# Patient Record
Sex: Female | Born: 1970 | Race: White | Hispanic: No | Marital: Married | Smoking: Former smoker
Health system: Southern US, Community
[De-identification: ages and names within clinical notes are randomized; demographics above are authoritative.]

## PROBLEM LIST (undated history)

## (undated) DIAGNOSIS — N189 Chronic kidney disease, unspecified: Secondary | ICD-10-CM

## (undated) DIAGNOSIS — R112 Nausea with vomiting, unspecified: Secondary | ICD-10-CM

## (undated) DIAGNOSIS — Z9889 Other specified postprocedural states: Secondary | ICD-10-CM

## (undated) DIAGNOSIS — J302 Other seasonal allergic rhinitis: Secondary | ICD-10-CM

## (undated) DIAGNOSIS — J40 Bronchitis, not specified as acute or chronic: Secondary | ICD-10-CM

## (undated) HISTORY — PX: AUGMENTATION MAMMAPLASTY: SUR837

## (undated) HISTORY — PX: OTHER SURGICAL HISTORY: SHX169

---
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
## 1989-05-03 HISTORY — PX: KNEE ARTHROSCOPY: SHX127

## 1990-05-03 HISTORY — PX: KNEE ARTHROSCOPY: SUR90

## 1992-05-03 HISTORY — PX: CYSTOSCOPY W/ STONE MANIPULATION: SHX1427

## 1997-05-03 HISTORY — PX: BREAST ENHANCEMENT SURGERY: SHX7

## 1998-05-03 HISTORY — PX: AUGMENTATION MAMMAPLASTY: SUR837

## 2009-09-11 ENCOUNTER — Other Ambulatory Visit: Admission: RE | Admit: 2009-09-11 | Discharge: 2009-09-11 | Payer: Self-pay | Admitting: Gynecology

## 2009-09-11 ENCOUNTER — Ambulatory Visit: Payer: Self-pay | Admitting: Gynecology

## 2010-01-23 ENCOUNTER — Ambulatory Visit: Payer: Self-pay | Admitting: Gynecology

## 2010-03-14 ENCOUNTER — Emergency Department (HOSPITAL_COMMUNITY): Admission: EM | Admit: 2010-03-14 | Discharge: 2010-03-14 | Payer: Self-pay | Admitting: Emergency Medicine

## 2010-04-10 ENCOUNTER — Ambulatory Visit: Payer: Self-pay | Admitting: Gynecology

## 2010-04-13 ENCOUNTER — Ambulatory Visit: Payer: Self-pay | Admitting: Gynecology

## 2010-05-12 ENCOUNTER — Ambulatory Visit
Admission: RE | Admit: 2010-05-12 | Discharge: 2010-05-12 | Payer: Self-pay | Source: Home / Self Care | Attending: Gynecology | Admitting: Gynecology

## 2012-08-01 HISTORY — PX: HIP SURGERY: SHX245

## 2012-08-11 ENCOUNTER — Other Ambulatory Visit: Payer: Self-pay | Admitting: Plastic Surgery

## 2012-08-11 ENCOUNTER — Encounter (HOSPITAL_BASED_OUTPATIENT_CLINIC_OR_DEPARTMENT_OTHER): Payer: Self-pay | Admitting: *Deleted

## 2012-08-11 DIAGNOSIS — M7989 Other specified soft tissue disorders: Secondary | ICD-10-CM
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
## 2012-08-15 ENCOUNTER — Other Ambulatory Visit: Payer: Self-pay | Admitting: Plastic Surgery
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
## 2012-08-17 ENCOUNTER — Encounter (HOSPITAL_BASED_OUTPATIENT_CLINIC_OR_DEPARTMENT_OTHER): Payer: Self-pay | Admitting: Anesthesiology

## 2012-08-17 ENCOUNTER — Ambulatory Visit (HOSPITAL_BASED_OUTPATIENT_CLINIC_OR_DEPARTMENT_OTHER): Payer: Worker's Compensation | Admitting: Certified Registered"

## 2012-08-17 ENCOUNTER — Ambulatory Visit (HOSPITAL_BASED_OUTPATIENT_CLINIC_OR_DEPARTMENT_OTHER)
Admission: RE | Admit: 2012-08-17 | Discharge: 2012-08-17 | Disposition: A | Discharge status: Home / Self Care | Payer: Worker's Compensation | Source: Ambulatory Visit | Attending: Plastic Surgery | Admitting: Plastic Surgery

## 2012-08-17 ENCOUNTER — Encounter (HOSPITAL_BASED_OUTPATIENT_CLINIC_OR_DEPARTMENT_OTHER): Payer: Self-pay | Admitting: Certified Registered"

## 2012-08-17 ENCOUNTER — Encounter (HOSPITAL_BASED_OUTPATIENT_CLINIC_OR_DEPARTMENT_OTHER)
Admission: RE | Disposition: A | Discharge status: Home / Self Care | Payer: Self-pay | Source: Ambulatory Visit | Attending: Plastic Surgery

## 2012-08-17 DIAGNOSIS — D179 Benign lipomatous neoplasm, unspecified: Secondary | ICD-10-CM | POA: Diagnosis present

## 2012-08-17 DIAGNOSIS — M7989 Other specified soft tissue disorders: Secondary | ICD-10-CM

## 2012-08-17 DIAGNOSIS — L988 Other specified disorders of the skin and subcutaneous tissue: Secondary | ICD-10-CM | POA: Insufficient documentation

## 2012-08-17 HISTORY — DX: Chronic kidney disease, unspecified: N18.9

## 2012-08-17 HISTORY — DX: Other specified postprocedural states: R11.2

## 2012-08-17 HISTORY — DX: Bronchitis, not specified as acute or chronic: J40

## 2012-08-17 HISTORY — DX: Other seasonal allergic rhinitis: J30.2

## 2012-08-17 HISTORY — DX: Other specified postprocedural states: Z98.890

## 2012-08-17 HISTORY — PX: MASS EXCISION: SHX2000

## 2012-08-17 SURGERY — EXCISION MASS
Anesthesia: General | Site: Thigh | Laterality: Right | Wound class: Clean

## 2012-08-17 MED ORDER — LACTATED RINGERS IV SOLN
INTRAVENOUS | Status: DC
  Administered 2012-08-17: 12:00:00 via INTRAVENOUS

## 2012-08-17 MED ORDER — ACETAMINOPHEN 10 MG/ML IV SOLN
1000.0000 mg | Freq: Once | INTRAVENOUS | Status: AC
  Administered 2012-08-17: 1000 mg via INTRAVENOUS

## 2012-08-17 MED ORDER — PROPOFOL 10 MG/ML IV BOLUS
INTRAVENOUS | Status: DC | PRN
  Administered 2012-08-17: 200 mg via INTRAVENOUS

## 2012-08-17 MED ORDER — SCOPOLAMINE 1 MG/3DAYS TD PT72
1.0000 | MEDICATED_PATCH | TRANSDERMAL | Status: DC
  Administered 2012-08-17: 1.5 mg via TRANSDERMAL

## 2012-08-17 MED ORDER — HYDROMORPHONE HCL PF 1 MG/ML IJ SOLN: 0.2500 mg | INTRAMUSCULAR | Status: DC | PRN

## 2012-08-17 MED ORDER — MIDAZOLAM HCL 5 MG/5ML IJ SOLN
INTRAMUSCULAR | Status: DC | PRN
  Administered 2012-08-17: 2 mg via INTRAVENOUS

## 2012-08-17 MED ORDER — OXYCODONE HCL 5 MG/5ML PO SOLN: 5.0000 mg | Freq: Once | ORAL | Status: AC | PRN

## 2012-08-17 MED ORDER — DEXAMETHASONE SODIUM PHOSPHATE 4 MG/ML IJ SOLN
INTRAMUSCULAR | Status: DC | PRN
  Administered 2012-08-17: 10 mg via INTRAVENOUS

## 2012-08-17 MED ORDER — LIDOCAINE-EPINEPHRINE 1 %-1:100000 IJ SOLN
INTRAMUSCULAR | Status: DC | PRN
  Administered 2012-08-17: 20 mL

## 2012-08-17 MED ORDER — ONDANSETRON HCL 4 MG/2ML IJ SOLN: 4.0000 mg | Freq: Once | INTRAMUSCULAR | Status: DC | PRN

## 2012-08-17 MED ORDER — CEFAZOLIN SODIUM-DEXTROSE 2-3 GM-% IV SOLR
2.0000 g | INTRAVENOUS | Status: AC
  Administered 2012-08-17: 2 g via INTRAVENOUS

## 2012-08-17 MED ORDER — FENTANYL CITRATE 0.05 MG/ML IJ SOLN
INTRAMUSCULAR | Status: DC | PRN
  Administered 2012-08-17: 100 ug via INTRAVENOUS

## 2012-08-17 MED ORDER — ONDANSETRON HCL 4 MG/2ML IJ SOLN
INTRAMUSCULAR | Status: DC | PRN
  Administered 2012-08-17: 4 mg via INTRAVENOUS

## 2012-08-17 MED ORDER — OXYCODONE HCL 5 MG PO TABS
5.0000 mg | ORAL_TABLET | Freq: Once | ORAL | Status: AC | PRN
  Administered 2012-08-17: 5 mg via ORAL

## 2012-08-17 MED ORDER — LIDOCAINE HCL (CARDIAC) 20 MG/ML IV SOLN
INTRAVENOUS | Status: DC | PRN
  Administered 2012-08-17: 80 mg via INTRAVENOUS

## 2012-08-17 SURGICAL SUPPLY — 90 items
BAG DECANTER FOR FLEXI CONT (MISCELLANEOUS) IMPLANT
BANDAGE ELASTIC 3 VELCRO ST LF (GAUZE/BANDAGES/DRESSINGS) IMPLANT
BANDAGE ELASTIC 4 VELCRO ST LF (GAUZE/BANDAGES/DRESSINGS) IMPLANT
BANDAGE ELASTIC 6 VELCRO ST LF (GAUZE/BANDAGES/DRESSINGS) IMPLANT
BANDAGE GAUZE ELAST BULKY 4 IN (GAUZE/BANDAGES/DRESSINGS) IMPLANT
BENZOIN TINCTURE PRP APPL 2/3 (GAUZE/BANDAGES/DRESSINGS) IMPLANT
BLADE HEX COATED 2.75 (ELECTRODE) ×2 IMPLANT
BLADE MINI RND TIP GREEN BEAV (BLADE) IMPLANT
BLADE SURG 10 STRL SS (BLADE) IMPLANT
BLADE SURG 15 STRL LF DISP TIS (BLADE) ×1 IMPLANT
BLADE SURG 15 STRL SS (BLADE) ×1
BNDG COHESIVE 1X5 TAN STRL LF (GAUZE/BANDAGES/DRESSINGS) IMPLANT
BNDG COHESIVE 4X5 TAN STRL (GAUZE/BANDAGES/DRESSINGS) IMPLANT
BNDG ESMARK 4X9 LF (GAUZE/BANDAGES/DRESSINGS) IMPLANT
CANISTER OMNI JUG 16 LITER (MISCELLANEOUS) IMPLANT
CANISTER SUCTION 1200CC (MISCELLANEOUS) ×2 IMPLANT
CANISTER SUCTION 2500CC (MISCELLANEOUS) IMPLANT
CHLORAPREP W/TINT 26ML (MISCELLANEOUS) ×2 IMPLANT
CLOTH BEACON ORANGE TIMEOUT ST (SAFETY) ×2 IMPLANT
CORDS BIPOLAR (ELECTRODE) IMPLANT
COVER MAYO STAND STRL (DRAPES) ×2 IMPLANT
COVER TABLE BACK 60X90 (DRAPES) ×2 IMPLANT
DECANTER SPIKE VIAL GLASS SM (MISCELLANEOUS) IMPLANT
DERMABOND ADVANCED (GAUZE/BANDAGES/DRESSINGS) ×1
DERMABOND ADVANCED .7 DNX12 (GAUZE/BANDAGES/DRESSINGS) ×1 IMPLANT
DRAIN PENROSE 1/2X12 LTX STRL (WOUND CARE) IMPLANT
DRAPE EXTREMITY T 121X128X90 (DRAPE) ×2 IMPLANT
DRAPE INCISE IOBAN 66X45 STRL (DRAPES) IMPLANT
DRSG ADAPTIC 3X8 NADH LF (GAUZE/BANDAGES/DRESSINGS) IMPLANT
DRSG EMULSION OIL 3X3 NADH (GAUZE/BANDAGES/DRESSINGS) IMPLANT
DRSG PAD ABDOMINAL 8X10 ST (GAUZE/BANDAGES/DRESSINGS) IMPLANT
ELECT NEEDLE TIP 2.8 STRL (NEEDLE) IMPLANT
ELECT REM PT RETURN 9FT ADLT (ELECTROSURGICAL) ×2
ELECTRODE REM PT RTRN 9FT ADLT (ELECTROSURGICAL) ×1 IMPLANT
GAUZE SPONGE 4X4 12PLY STRL LF (GAUZE/BANDAGES/DRESSINGS) IMPLANT
GAUZE XEROFORM 1X8 LF (GAUZE/BANDAGES/DRESSINGS) IMPLANT
GAUZE XEROFORM 5X9 LF (GAUZE/BANDAGES/DRESSINGS) IMPLANT
GLOVE BIO SURGEON STRL SZ 6.5 (GLOVE) ×4 IMPLANT
GLOVE BIOGEL PI IND STRL 7.0 (GLOVE) ×1 IMPLANT
GLOVE BIOGEL PI INDICATOR 7.0 (GLOVE) ×1
GLOVE ECLIPSE 6.5 STRL STRAW (GLOVE) ×2 IMPLANT
GOWN PREVENTION PLUS XLARGE (GOWN DISPOSABLE) ×6 IMPLANT
HANDPIECE INTERPULSE COAX TIP (DISPOSABLE)
IV NS IRRIG 3000ML ARTHROMATIC (IV SOLUTION) IMPLANT
NEEDLE 27GAX1X1/2 (NEEDLE) ×2 IMPLANT
NEEDLE HYPO 30GX1 BEV (NEEDLE) IMPLANT
NS IRRIG 1000ML POUR BTL (IV SOLUTION) ×2 IMPLANT
PACK BASIN DAY SURGERY FS (CUSTOM PROCEDURE TRAY) ×2 IMPLANT
PADDING CAST ABS 3INX4YD NS (CAST SUPPLIES)
PADDING CAST ABS 4INX4YD NS (CAST SUPPLIES)
PADDING CAST ABS COTTON 3X4 (CAST SUPPLIES) IMPLANT
PADDING CAST ABS COTTON 4X4 ST (CAST SUPPLIES) IMPLANT
PENCIL BUTTON HOLSTER BLD 10FT (ELECTRODE) ×2 IMPLANT
SET HNDPC FAN SPRY TIP SCT (DISPOSABLE) IMPLANT
SHEET MEDIUM DRAPE 40X70 STRL (DRAPES) IMPLANT
SLEEVE SCD COMPRESS KNEE MED (MISCELLANEOUS) ×2 IMPLANT
SPLINT PLASTER CAST XFAST 3X15 (CAST SUPPLIES) IMPLANT
SPLINT PLASTER XTRA FASTSET 3X (CAST SUPPLIES)
SPONGE GAUZE 2X2 8PLY STRL LF (GAUZE/BANDAGES/DRESSINGS) ×2 IMPLANT
SPONGE GAUZE 4X4 12PLY (GAUZE/BANDAGES/DRESSINGS) ×2 IMPLANT
SPONGE LAP 18X18 X RAY DECT (DISPOSABLE) ×2 IMPLANT
SPONGE LAP 4X18 X RAY DECT (DISPOSABLE) IMPLANT
STAPLER VISISTAT 35W (STAPLE) IMPLANT
STOCKINETTE 4X48 STRL (DRAPES) IMPLANT
STOCKINETTE 6  STRL (DRAPES) ×1
STOCKINETTE 6 STRL (DRAPES) ×1 IMPLANT
STOCKINETTE IMPERVIOUS LG (DRAPES) IMPLANT
STRIP CLOSURE SKIN 1/2X4 (GAUZE/BANDAGES/DRESSINGS) IMPLANT
SUCTION FRAZIER TIP 10 FR DISP (SUCTIONS) IMPLANT
SURGILUBE 2OZ TUBE FLIPTOP (MISCELLANEOUS) IMPLANT
SUT ETHILON 3 0 PS 1 (SUTURE) IMPLANT
SUT ETHILON 4 0 P 3 18 (SUTURE) IMPLANT
SUT ETHILON 5 0 PS 2 18 (SUTURE) IMPLANT
SUT MON AB 5-0 PS2 18 (SUTURE) ×2 IMPLANT
SUT PROLENE 3 0 PS 2 (SUTURE) IMPLANT
SUT SILK 3 0 PS 1 (SUTURE) IMPLANT
SUT VIC AB 3-0 FS2 27 (SUTURE) IMPLANT
SUT VIC AB 5-0 P-3 18X BRD (SUTURE) IMPLANT
SUT VIC AB 5-0 P3 18 (SUTURE)
SUT VIC AB 5-0 PS2 18 (SUTURE) IMPLANT
SUT VICRYL 4-0 PS2 18IN ABS (SUTURE) ×2 IMPLANT
SYR BULB IRRIGATION 50ML (SYRINGE) ×2 IMPLANT
SYR CONTROL 10ML LL (SYRINGE) ×2 IMPLANT
TAPE HYPAFIX 6X30 (GAUZE/BANDAGES/DRESSINGS) IMPLANT
TAPE STRIPS DRAPE STRL (GAUZE/BANDAGES/DRESSINGS) ×2 IMPLANT
TOWEL OR 17X24 6PK STRL BLUE (TOWEL DISPOSABLE) ×4 IMPLANT
TRAY DSU PREP LF (CUSTOM PROCEDURE TRAY) IMPLANT
TUBE CONNECTING 20X1/4 (TUBING) ×2 IMPLANT
UNDERPAD 30X30 INCONTINENT (UNDERPADS AND DIAPERS) ×2 IMPLANT
YANKAUER SUCT BULB TIP NO VENT (SUCTIONS) ×2 IMPLANT

## 2012-08-17 NOTE — Anesthesia Postprocedure Evaluation (Signed)
  Anesthesia Post-op Note  Patient: Deborah Bright  Procedure(s) Performed: Procedure(s): EXCISION OF RIGHT LEG FAT NECROSIS (Right)  Patient Location: PACU  Anesthesia Type:General  Level of Consciousness: awake, alert  and oriented  Airway and Oxygen Therapy: Patient Spontanous Breathing and Patient connected to face mask oxygen  Post-op Pain: none  Post-op Assessment: Post-op Vital signs reviewed  Post-op Vital Signs: Reviewed  Complications: No apparent anesthesia complications
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
## 2012-08-17 NOTE — Interval H&P Note (Signed)
History and Physical Interval Note:  08/17/2012 11:41 AM  Deborah Bright  has presented today for surgery, with the diagnosis of fat necrosis of right leg  The various methods of treatment have been discussed with the patient and family. After consideration of risks, benefits and other options for treatment, the patient has consented to  Procedure(s): EXCISION OF RIGHT LEG FAT NECROSIS (Right) as a surgical intervention .  The patient's history has been reviewed, patient examined, no change in status, stable for surgery.  I have reviewed the patient's chart and labs.  Questions were answered to the patient's satisfaction.     Bright,Deborah Bitter

## 2012-08-17 NOTE — Brief Op Note (Signed)
08/17/2012  12:51 PM  PATIENT:  Deborah Bright  42 y.o. female  PRE-OPERATIVE DIAGNOSIS:  fat necrosis of right leg  POST-OPERATIVE DIAGNOSIS:  fat necrosis of right leg  PROCEDURE:  Procedure(s): EXCISION OF RIGHT LEG FAT NECROSIS (Right)  SURGEON:  Surgeon(s) and Role:    * Claire Sanger, DO - Primary  PHYSICIAN ASSISTANT: Shawn Rayburn, PA  ASSISTANTS: none   ANESTHESIA:   local and general  EBL:  Total I/O In: 1500 [I.V.:1500] Out: -   BLOOD ADMINISTERED:none  DRAINS: none   LOCAL MEDICATIONS USED:  LIDOCAINE   SPECIMEN:  Source of Specimen:  right leg fat - fat necrosis  DISPOSITION OF SPECIMEN:  PATHOLOGY  COUNTS:  YES  TOURNIQUET:  * No tourniquets in log *  DICTATION: .Dragon Dictation  PLAN OF CARE: Discharge to home after PACU  PATIENT DISPOSITION:  PACU - hemodynamically stable.   Delay start of Pharmacological VTE agent (>24hrs) due to surgical blood loss or risk of bleeding: no

## 2012-08-17 NOTE — Anesthesia Procedure Notes (Signed)
Procedure Name: LMA Insertion Performed by: Jamarea Selner W Pre-anesthesia Checklist: Patient identified, Timeout performed, Emergency Drugs available, Suction available and Patient being monitored Patient Re-evaluated:Patient Re-evaluated prior to inductionOxygen Delivery Method: Circle system utilized Preoxygenation: Pre-oxygenation with 100% oxygen Intubation Type: IV induction Ventilation: Mask ventilation without difficulty LMA: LMA inserted LMA Size: 4.0 Number of attempts: 1 Placement Confirmation: breath sounds checked- equal and bilateral and positive ETCO2 Tube secured with: Tape Dental Injury: Teeth and Oropharynx as per pre-operative assessment      

## 2012-08-17 NOTE — Anesthesia Preprocedure Evaluation (Signed)

## 2012-08-17 NOTE — Transfer of Care (Signed)
Immediate Anesthesia Transfer of Care Note  Patient: Deborah Bright  Procedure(s) Performed: Procedure(s): EXCISION OF RIGHT LEG FAT NECROSIS (Right)  Patient Location: PACU  Anesthesia Type:General  Level of Consciousness: awake, alert  and oriented  Airway & Oxygen Therapy: Patient Spontanous Breathing and Patient connected to face mask oxygen  Post-op Assessment: Report given to PACU RN and Post -op Vital signs reviewed and stable  Post vital signs: Reviewed and stable  Complications: No apparent anesthesia complications

## 2012-08-21 ENCOUNTER — Encounter (HOSPITAL_BASED_OUTPATIENT_CLINIC_OR_DEPARTMENT_OTHER): Payer: Self-pay | Admitting: Plastic Surgery

## 2014-01-04 ENCOUNTER — Ambulatory Visit (INDEPENDENT_AMBULATORY_CARE_PROVIDER_SITE_OTHER): Payer: 59 | Admitting: Gynecology

## 2014-01-04 ENCOUNTER — Encounter: Payer: Self-pay | Admitting: Gynecology

## 2014-01-04 ENCOUNTER — Other Ambulatory Visit: Payer: 59

## 2014-01-04 ENCOUNTER — Other Ambulatory Visit (HOSPITAL_COMMUNITY)
Admission: RE | Admit: 2014-01-04 | Discharge: 2014-01-04 | Disposition: A | Discharge status: Home / Self Care | Payer: 59 | Source: Ambulatory Visit | Attending: Gynecology | Admitting: Gynecology

## 2014-01-04 VITALS — BP 118/70 | Ht 66.0 in | Wt 160.0 lb

## 2014-01-04 DIAGNOSIS — N760 Acute vaginitis: Secondary | ICD-10-CM

## 2014-01-04 DIAGNOSIS — Z01419 Encounter for gynecological examination (general) (routine) without abnormal findings: Secondary | ICD-10-CM | POA: Diagnosis not present

## 2014-01-04 DIAGNOSIS — Z1151 Encounter for screening for human papillomavirus (HPV): Secondary | ICD-10-CM | POA: Diagnosis present

## 2014-01-04 DIAGNOSIS — B9689 Other specified bacterial agents as the cause of diseases classified elsewhere: Secondary | ICD-10-CM

## 2014-01-04 DIAGNOSIS — N898 Other specified noninflammatory disorders of vagina: Secondary | ICD-10-CM

## 2014-01-04 DIAGNOSIS — R635 Abnormal weight gain: Secondary | ICD-10-CM

## 2014-01-04 DIAGNOSIS — T8339XA Other mechanical complication of intrauterine contraceptive device, initial encounter: Secondary | ICD-10-CM

## 2014-01-04 DIAGNOSIS — A499 Bacterial infection, unspecified: Secondary | ICD-10-CM

## 2014-01-04 LAB — WET PREP FOR TRICH, YEAST, CLUE
TRICH WET PREP: NONE SEEN
WBC, Wet Prep HPF POC: NONE SEEN
YEAST WET PREP: NONE SEEN

## 2014-01-04 MED ORDER — TINIDAZOLE 500 MG PO TABS: ORAL_TABLET | ORAL | Status: DC

## 2014-01-04 MED ORDER — IBUPROFEN 800 MG PO TABS: 800.0000 mg | ORAL_TABLET | Freq: Three times a day (TID) | ORAL | Status: DC | PRN

## 2014-01-04 NOTE — Patient Instructions (Addendum)
Tinidazole tablets What is this medicine? TINIDAZOLE (tye NI da zole) is an antiinfective. It is used to treat amebiasis, giardiasis, trichomoniasis, and vaginosis. It will not work for colds, flu, or other viral infections. This medicine may be used for other purposes; ask your health care provider or pharmacist if you have questions. COMMON BRAND NAME(S): Tindamax What should I tell my health care provider before I take this medicine? They need to know if you have any of these conditions: -anemia or other blood disorders -if you frequently drink alcohol containing drinks -receiving hemodialysis -seizure disorder -an unusual or allergic reaction to tinidazole, other medicines, foods, dyes, or preservatives -pregnant or trying to get pregnant -breast-feeding How should I use this medicine? Take this medicine by mouth with a full glass of water. Follow the directions on the prescription label. Take with food. Take your medicine at regular intervals. Do not take your medicine more often than directed. Take all of your medicine as directed even if you think you are better. Do not skip doses or stop your medicine early. Talk to your pediatrician regarding the use of this medicine in children. While this drug may be prescribed for children as young as 3 years of age for selected conditions, precautions do apply. Overdosage: If you think you have taken too much of this medicine contact a poison control center or emergency room at once. NOTE: This medicine is only for you. Do not share this medicine with others. What if I miss a dose? If you miss a dose, take it as soon as you can. If it is almost time for your next dose, take only that dose. Do not take double or extra doses. What may interact with this medicine? Do not take this medicine with any of the following medications: -alcohol or any product that contains alcohol -amprenavir oral solution -disulfiram -paclitaxel injection -ritonavir  oral solution -sertraline oral solution -sulfamethoxazole-trimethoprim injection This medicine may also interact with the following medications: -cholestyramine -cimetidine -conivaptan -cyclosporin -fluorouracil -fosphenytoin, phenytoin -ketoconazole -lithium -phenobarbital -tacrolimus -warfarin This list may not describe all possible interactions. Give your health care provider a list of all the medicines, herbs, non-prescription drugs, or dietary supplements you use. Also tell them if you smoke, drink alcohol, or use illegal drugs. Some items may interact with your medicine. What should I watch for while using this medicine? Tell your doctor or health care professional if your symptoms do not improve or if they get worse. Avoid alcoholic drinks while you are taking this medicine and for three days afterward. Alcohol may make you feel dizzy, sick, or flushed. If you are being treated for a sexually transmitted disease, avoid sexual contact until you have finished your treatment. Your sexual partner may also need treatment. What side effects may I notice from receiving this medicine? Side effects that you should report to your doctor or health care professional as soon as possible: -allergic reactions like skin rash, itching or hives, swelling of the face, lips, or tongue -breathing problems -confusion, depression -dark or white patches in the mouth -feeling faint or lightheaded, falls -fever, infection -numbness, tingling, pain or weakness in the hands or feet -pain when passing urine -seizures -unusually weak or tired -vaginal irritation or discharge -vomiting Side effects that usually do not require medical attention (report to your doctor or health care professional if they continue or are bothersome): -dark brown or reddish urine -diarrhea -headache -loss of appetite -metallic taste -nausea -stomach upset This list may not describe all  possible side effects. Call your  doctor for medical advice about side effects. You may report side effects to FDA at 1-800-FDA-1088. Where should I keep my medicine? Keep out of the reach of children. Store at room temperature between 15 and 30 degrees C (59 and 86 degrees F). Protect from light and moisture. Keep container tightly closed. Throw away any unused medicine after the expiration date. NOTE: This sheet is a summary. It may not cover all possible information. If you have questions about this medicine, talk to your doctor, pharmacist, or health care provider.  2015, Elsevier/Gold Standard. (2008-01-15 15:22:28) Bacterial Vaginosis Bacterial vaginosis is a vaginal infection that occurs when the normal balance of bacteria in the vagina is disrupted. It results from an overgrowth of certain bacteria. This is the most common vaginal infection in women of childbearing age. Treatment is important to prevent complications, especially in pregnant women, as it can cause a premature delivery. CAUSES  Bacterial vaginosis is caused by an increase in harmful bacteria that are normally present in smaller amounts in the vagina. Several different kinds of bacteria can cause bacterial vaginosis. However, the reason that the condition develops is not fully understood. RISK FACTORS Certain activities or behaviors can put you at an increased risk of developing bacterial vaginosis, including:  Having a new sex partner or multiple sex partners.  Douching.  Using an intrauterine device (IUD) for contraception. Women do not get bacterial vaginosis from toilet seats, bedding, swimming pools, or contact with objects around them. SIGNS AND SYMPTOMS  Some women with bacterial vaginosis have no signs or symptoms. Common symptoms include:  Grey vaginal discharge.  A fishlike odor with discharge, especially after sexual intercourse.  Itching or burning of the vagina and vulva.  Burning or pain with urination. DIAGNOSIS  Your health care  provider will take a medical history and examine the vagina for signs of bacterial vaginosis. A sample of vaginal fluid may be taken. Your health care provider will look at this sample under a microscope to check for bacteria and abnormal cells. A vaginal pH test may also be done.  TREATMENT  Bacterial vaginosis may be treated with antibiotic medicines. These may be given in the form of a pill or a vaginal cream. A second round of antibiotics may be prescribed if the condition comes back after treatment.  HOME CARE INSTRUCTIONS   Only take over-the-counter or prescription medicines as directed by your health care provider.  If antibiotic medicine was prescribed, take it as directed. Make sure you finish it even if you start to feel better.  Do not have sex until treatment is completed.  Tell all sexual partners that you have a vaginal infection. They should see their health care provider and be treated if they have problems, such as a mild rash or itching.  Practice safe sex by using condoms and only having one sex partner. SEEK MEDICAL CARE IF:   Your symptoms are not improving after 3 days of treatment.  You have increased discharge or pain.  You have a fever. MAKE SURE YOU:   Understand these instructions.  Will watch your condition.  Will get help right away if you are not doing well or get worse. FOR MORE INFORMATION  Centers for Disease Control and Prevention, Division of STD Prevention: www.cdc.gov/std American Sexual Health Association (ASHA): www.ashastd.org  Document Released: 04/19/2005 Document Revised: 02/07/2013 Document Reviewed: 11/29/2012 ExitCare Patient Information 2015 [employer]. This information is not intended to replace advice given to you   by your health care provider. Make sure you discuss any questions you have with your health care provider.  

## 2014-01-04 NOTE — Addendum Note (Signed)
Addended by: Thurnell Garbe A on: 01/04/2014 03:25 PM   Modules accepted: Orders

## 2014-01-04 NOTE — Progress Notes (Signed)
Deborah Bright 02/24/1971 [id number]   History:    43 y.o.  for annual gyn exam who has not been seen in the office since 2011. At that time patient had a ParaGard T380A IUD removed. Patient then complained for quite some time of foul vaginal odor discharge. Her husband has had a vasectomy and she wishes to have her IUD removed. Patient has not had a Pap smear or any blood work done since that time. Patient denies any prior history of abnormal Pap smear. She reports monthly regular cycles. Sometimes they're heavier than others.  Past medical history,surgical history, family history and social history were all reviewed and documented in the EPIC chart.  Gynecologic History Patient's last menstrual period was 12/26/2013. Contraception: IUD Last Pap: 2011. Results were: normal Last mammogram: Over 5 years ago. Results were: normal  Obstetric History OB History  Gravida Para Term Preterm AB SAB TAB Ectopic Multiple Living  0                  ROS: A ROS was performed and pertinent positives and negatives are included in the history.  GENERAL: No fevers or chills. HEENT: No change in vision, no earache, sore throat or sinus congestion. NECK: No pain or stiffness. CARDIOVASCULAR: No chest pain or pressure. No palpitations. PULMONARY: No shortness of breath, cough or wheeze. GASTROINTESTINAL: No abdominal pain, nausea, vomiting or diarrhea, melena or bright red blood per rectum. GENITOURINARY: No urinary frequency, urgency, hesitancy or dysuria. MUSCULOSKELETAL: No joint or muscle pain, no back pain, no recent trauma. DERMATOLOGIC: No rash, no itching, no lesions. ENDOCRINE: No polyuria, polydipsia, no heat or cold intolerance. No recent change in weight. HEMATOLOGICAL: No anemia or easy bruising or bleeding. NEUROLOGIC: No headache, seizures, numbness, tingling or weakness. PSYCHIATRIC: No depression, no loss of interest in normal activity or change in sleep pattern.     Exam: chaperone  present  BP 118/70  Ht 5\' 6"  (1.676 m)  Wt 160 lb (72.576 kg)  BMI 25.84 kg/m2  LMP 12/26/2013  Body mass index is 25.84 kg/(m^2).  General appearance : Well developed well nourished female. No acute distress HEENT: Neck supple, trachea midline, no carotid bruits, no thyroidmegaly Lungs: Clear to auscultation, no rhonchi or wheezes, or rib retractions  Heart: Regular rate and rhythm, no murmurs or gallops Breast:Examined in sitting and supine position were symmetrical in appearance, no palpable masses or tenderness,  no skin retraction, no nipple inversion, no nipple discharge, no skin discoloration, no axillary or supraclavicular lymphadenopathy Abdomen: no palpable masses or tenderness, no rebound or guarding Extremities: no edema or skin discoloration or tenderness  Pelvic:  Bartholin, Urethra, Skene Glands: Within normal limits             Vagina: Creamy foul-smelling discharge  Cervix: No gross lesions or discharge, IUD string seen  Uterus  anteverted, normal size, shape and consistency, non-tender and mobile  Adnexa  Without masses or tenderness  Anus and perineum  normal   Rectovaginal  normal sphincter tone without palpated masses or tenderness             Hemoccult that indicated   Wet prep: Amine positive, clue cell moderate, too numerous to count bacteria  As per patient's request the ParaGard T380A IUD string was grasped with a [place] clamp and on bearing down the IUD was removed. It was noted that the IUD was fragmented missing one of the T. arms. This was shown to the patient and her  husband.  Assessment/Plan:  43 y.o. female for annual exam with clinical evidence of bacterial vaginosis. She will be treated with Tindamax 500 mg 4 tablets today and then to repeat in 24 hours. She will return back to the office in 1-2 weeks for sonohysterogram to see the small piece of the IUD is still floating inside the uterine cavity to retrieve it after the infection has cleared. If  we are unsuccessful in removing it in the office explained to the patient that we may need to do it as an outpatient procedure hysteroscopic weight. Her Pap smear was done today. When she returns she will return back in the fasting state the following labs drawn: CBC, comprehensive metabolic panel, TSH, fasting lipid profile and urinalysis. She was provided with a requisition to schedule her mammogram. We discussed the importance of monthly breast exam. We discussed the importance of calcium and vitamin D and regular exercise for osteoporosis prevention. Patient declined flu vaccine.  Note: This dictation was prepared with  Dragon/digital dictation along withSmart phrase technology. Any transcriptional errors that result from this process are unintentional.   Terrance Mass MD, 2:59 PM 01/04/2014

## 2014-01-10 LAB — CYTOLOGY - PAP

## 2014-01-11 ENCOUNTER — Other Ambulatory Visit: Payer: Self-pay | Admitting: Gynecology

## 2014-01-11 ENCOUNTER — Ambulatory Visit: Admit: 2014-01-11 | Payer: Self-pay | Admitting: Gynecology

## 2014-01-11 ENCOUNTER — Other Ambulatory Visit: Payer: 59

## 2014-01-11 ENCOUNTER — Encounter (HOSPITAL_COMMUNITY): Payer: 59 | Admitting: Certified Registered"

## 2014-01-11 ENCOUNTER — Ambulatory Visit (INDEPENDENT_AMBULATORY_CARE_PROVIDER_SITE_OTHER): Payer: 59

## 2014-01-11 ENCOUNTER — Inpatient Hospital Stay (HOSPITAL_COMMUNITY): Payer: 59 | Admitting: Certified Registered"

## 2014-01-11 ENCOUNTER — Encounter (HOSPITAL_COMMUNITY): Payer: Self-pay | Admitting: Certified Registered"

## 2014-01-11 ENCOUNTER — Observation Stay (HOSPITAL_COMMUNITY)
Admission: AD | Admit: 2014-01-11 | Discharge: 2014-01-11 | Disposition: A | Discharge status: Home / Self Care | Payer: 59 | Source: Ambulatory Visit | Attending: Gynecology | Admitting: Gynecology

## 2014-01-11 ENCOUNTER — Encounter: Payer: Self-pay | Admitting: Gynecology

## 2014-01-11 ENCOUNTER — Ambulatory Visit (INDEPENDENT_AMBULATORY_CARE_PROVIDER_SITE_OTHER): Payer: 59 | Admitting: Gynecology

## 2014-01-11 ENCOUNTER — Encounter (HOSPITAL_COMMUNITY)
Admission: AD | Disposition: A | Discharge status: Home / Self Care | Payer: Self-pay | Source: Ambulatory Visit | Attending: Gynecology

## 2014-01-11 DIAGNOSIS — Z87442 Personal history of urinary calculi: Secondary | ICD-10-CM | POA: Insufficient documentation

## 2014-01-11 DIAGNOSIS — Y838 Other surgical procedures as the cause of abnormal reaction of the patient, or of later complication, without mention of misadventure at the time of the procedure: Secondary | ICD-10-CM | POA: Diagnosis not present

## 2014-01-11 DIAGNOSIS — Z30432 Encounter for removal of intrauterine contraceptive device: Principal | ICD-10-CM | POA: Insufficient documentation

## 2014-01-11 DIAGNOSIS — T8389XS Other specified complication of genitourinary prosthetic devices, implants and grafts, sequela: Secondary | ICD-10-CM

## 2014-01-11 DIAGNOSIS — Z87891 Personal history of nicotine dependence: Secondary | ICD-10-CM | POA: Insufficient documentation

## 2014-01-11 DIAGNOSIS — Z885 Allergy status to narcotic agent status: Secondary | ICD-10-CM | POA: Insufficient documentation

## 2014-01-11 DIAGNOSIS — Z9889 Other specified postprocedural states: Secondary | ICD-10-CM

## 2014-01-11 DIAGNOSIS — T8389XA Other specified complication of genitourinary prosthetic devices, implants and grafts, initial encounter: Secondary | ICD-10-CM | POA: Diagnosis not present

## 2014-01-11 DIAGNOSIS — T8339XA Other mechanical complication of intrauterine contraceptive device, initial encounter: Secondary | ICD-10-CM

## 2014-01-11 DIAGNOSIS — T889XXS Complication of surgical and medical care, unspecified, sequela: Secondary | ICD-10-CM

## 2014-01-11 HISTORY — PX: HYSTEROSCOPY WITH D & C: SHX1775

## 2014-01-11 LAB — URINALYSIS, ROUTINE W REFLEX MICROSCOPIC
BILIRUBIN URINE: NEGATIVE
Glucose, UA: NEGATIVE mg/dL
KETONES UR: NEGATIVE mg/dL
Leukocytes, UA: NEGATIVE
Nitrite: NEGATIVE
Protein, ur: NEGATIVE mg/dL
Specific Gravity, Urine: 1.015 (ref 1.005–1.030)
UROBILINOGEN UA: 0.2 mg/dL (ref 0.0–1.0)
pH: 5.5 (ref 5.0–8.0)

## 2014-01-11 LAB — ABO/RH: ABO/RH(D): A NEG

## 2014-01-11 LAB — CBC
HCT: 42.8 % (ref 36.0–46.0)
Hemoglobin: 15 g/dL (ref 12.0–15.0)
MCH: 33.1 pg (ref 26.0–34.0)
MCHC: 35 g/dL (ref 30.0–36.0)
MCV: 94.5 fL (ref 78.0–100.0)
PLATELETS: 185 10*3/uL (ref 150–400)
RBC: 4.53 MIL/uL (ref 3.87–5.11)
RDW: 12.4 % (ref 11.5–15.5)
WBC: 6.8 10*3/uL (ref 4.0–10.5)

## 2014-01-11 LAB — URINE MICROSCOPIC-ADD ON

## 2014-01-11 LAB — TYPE AND SCREEN
ABO/RH(D): A NEG
Antibody Screen: NEGATIVE

## 2014-01-11 LAB — POCT PREGNANCY, URINE: Preg Test, Ur: NEGATIVE

## 2014-01-11 LAB — PREGNANCY, URINE: Preg Test, Ur: NEGATIVE

## 2014-01-11 SURGERY — DILATATION AND CURETTAGE /HYSTEROSCOPY
Anesthesia: General

## 2014-01-11 MED ORDER — MIDAZOLAM HCL 2 MG/2ML IJ SOLN
INTRAMUSCULAR | Status: AC
  Filled 2014-01-11: qty 2

## 2014-01-11 MED ORDER — DEXTROSE 5 % IV SOLN
2.0000 g | INTRAVENOUS | Status: AC
  Administered 2014-01-11: 2 g via INTRAVENOUS
  Filled 2014-01-11: qty 2

## 2014-01-11 MED ORDER — LIDOCAINE HCL (CARDIAC) 20 MG/ML IV SOLN
INTRAVENOUS | Status: AC
  Filled 2014-01-11: qty 5

## 2014-01-11 MED ORDER — GLYCOPYRROLATE 0.2 MG/ML IJ SOLN
INTRAMUSCULAR | Status: AC
  Filled 2014-01-11: qty 1

## 2014-01-11 MED ORDER — LACTATED RINGERS IV SOLN
INTRAVENOUS | Status: DC
  Administered 2014-01-11: 11:00:00 via INTRAVENOUS

## 2014-01-11 MED ORDER — LIDOCAINE HCL (CARDIAC) 20 MG/ML IV SOLN
INTRAVENOUS | Status: DC | PRN
  Administered 2014-01-11: 50 mg via INTRAVENOUS

## 2014-01-11 MED ORDER — FENTANYL CITRATE 0.05 MG/ML IJ SOLN
INTRAMUSCULAR | Status: AC
  Filled 2014-01-11: qty 2

## 2014-01-11 MED ORDER — ONDANSETRON HCL 4 MG/2ML IJ SOLN
INTRAMUSCULAR | Status: AC
  Filled 2014-01-11: qty 2

## 2014-01-11 MED ORDER — FAMOTIDINE IN NACL 20-0.9 MG/50ML-% IV SOLN
20.0000 mg | Freq: Once | INTRAVENOUS | Status: AC
  Administered 2014-01-11: 20 mg via INTRAVENOUS
  Filled 2014-01-11: qty 50

## 2014-01-11 MED ORDER — SCOPOLAMINE 1 MG/3DAYS TD PT72
MEDICATED_PATCH | TRANSDERMAL | Status: DC | PRN
  Administered 2014-01-11: 1 via TRANSDERMAL

## 2014-01-11 MED ORDER — FENTANYL CITRATE 0.05 MG/ML IJ SOLN
25.0000 ug | INTRAMUSCULAR | Status: DC | PRN
Start: 2014-01-11 — End: 2014-01-11

## 2014-01-11 MED ORDER — KETOROLAC TROMETHAMINE 60 MG/2ML IM SOLN: 60.0000 mg | Freq: Once | INTRAMUSCULAR | Status: AC

## 2014-01-11 MED ORDER — MIDAZOLAM HCL 2 MG/2ML IJ SOLN
INTRAMUSCULAR | Status: DC | PRN
  Administered 2014-01-11: 2 mg via INTRAVENOUS

## 2014-01-11 MED ORDER — MEPERIDINE HCL 25 MG/ML IJ SOLN
6.2500 mg | INTRAMUSCULAR | Status: DC | PRN
Start: 2014-01-11 — End: 2014-01-11

## 2014-01-11 MED ORDER — METOCLOPRAMIDE HCL 10 MG PO TABS: 10.0000 mg | ORAL_TABLET | Freq: Three times a day (TID) | ORAL | Status: DC

## 2014-01-11 MED ORDER — KETOROLAC TROMETHAMINE 10 MG PO TABS: 10.0000 mg | ORAL_TABLET | Freq: Four times a day (QID) | ORAL | Status: DC | PRN

## 2014-01-11 MED ORDER — GLYCOPYRROLATE 0.2 MG/ML IJ SOLN
INTRAMUSCULAR | Status: DC | PRN
  Administered 2014-01-11: 0.2 mg via INTRAVENOUS

## 2014-01-11 MED ORDER — DEXAMETHASONE SODIUM PHOSPHATE 10 MG/ML IJ SOLN
INTRAMUSCULAR | Status: AC
  Filled 2014-01-11: qty 1

## 2014-01-11 MED ORDER — DEXAMETHASONE SODIUM PHOSPHATE 10 MG/ML IJ SOLN
INTRAMUSCULAR | Status: DC | PRN
  Administered 2014-01-11: 4 mg via INTRAVENOUS

## 2014-01-11 MED ORDER — PROMETHAZINE HCL 25 MG/ML IJ SOLN: 6.2500 mg | INTRAMUSCULAR | Status: DC | PRN

## 2014-01-11 MED ORDER — PROPOFOL 10 MG/ML IV BOLUS
INTRAVENOUS | Status: DC | PRN
  Administered 2014-01-11: 200 mg via INTRAVENOUS

## 2014-01-11 MED ORDER — FENTANYL CITRATE 0.05 MG/ML IJ SOLN
INTRAMUSCULAR | Status: DC | PRN
  Administered 2014-01-11 (×4): 50 ug via INTRAVENOUS

## 2014-01-11 MED ORDER — KETOROLAC TROMETHAMINE 30 MG/ML IJ SOLN
INTRAMUSCULAR | Status: AC
  Filled 2014-01-11: qty 1

## 2014-01-11 MED ORDER — SCOPOLAMINE 1 MG/3DAYS TD PT72
MEDICATED_PATCH | TRANSDERMAL | Status: AC
  Filled 2014-01-11: qty 1

## 2014-01-11 MED ORDER — KETOROLAC TROMETHAMINE 30 MG/ML IJ SOLN: 15.0000 mg | Freq: Once | INTRAMUSCULAR | Status: DC | PRN

## 2014-01-11 MED ORDER — ONDANSETRON HCL 4 MG/2ML IJ SOLN
INTRAMUSCULAR | Status: DC | PRN
  Administered 2014-01-11: 4 mg via INTRAVENOUS

## 2014-01-11 MED ORDER — PROPOFOL 10 MG/ML IV EMUL
INTRAVENOUS | Status: AC
  Filled 2014-01-11: qty 20

## 2014-01-11 MED ORDER — SODIUM CHLORIDE 0.9 % IR SOLN
Status: DC | PRN
  Administered 2014-01-11: 3000 mL

## 2014-01-11 SURGICAL SUPPLY — 21 items
CANISTER SUCT 3000ML (MISCELLANEOUS) ×3 IMPLANT
CATH ROBINSON RED A/P 16FR (CATHETERS) ×3 IMPLANT
CLOTH BEACON ORANGE TIMEOUT ST (SAFETY) ×3 IMPLANT
CONTAINER PREFILL 10% NBF 60ML (FORM) ×6 IMPLANT
CORD ACTIVE DISPOSABLE (ELECTRODE)
CORD ELECTRO ACTIVE DISP (ELECTRODE) IMPLANT
DRAPE HYSTEROSCOPY (DRAPE) ×3 IMPLANT
ELECT REM PT RETURN 9FT ADLT (ELECTROSURGICAL)
ELECT VAPORTRODE GRVD BAR (ELECTRODE) IMPLANT
ELECTRODE REM PT RTRN 9FT ADLT (ELECTROSURGICAL) IMPLANT
GLOVE BIOGEL PI IND STRL 8 (GLOVE) ×1 IMPLANT
GLOVE BIOGEL PI INDICATOR 8 (GLOVE) ×2
GLOVE ECLIPSE 7.5 STRL STRAW (GLOVE) ×6 IMPLANT
GOWN STRL REUS W/TWL LRG LVL3 (GOWN DISPOSABLE) ×6 IMPLANT
PACK VAGINAL MINOR WOMEN LF (CUSTOM PROCEDURE TRAY) ×3 IMPLANT
PAD OB MATERNITY 4.3X12.25 (PERSONAL CARE ITEMS) ×3 IMPLANT
PAD PREP 24X48 CUFFED NSTRL (MISCELLANEOUS) ×3 IMPLANT
SET TUBING HYSTEROSCOPY 2 NDL (TUBING) ×3 IMPLANT
TOWEL OR 17X24 6PK STRL BLUE (TOWEL DISPOSABLE) ×6 IMPLANT
TUBE HYSTEROSCOPY W Y-CONNECT (TUBING) ×3 IMPLANT
WATER STERILE IRR 1000ML POUR (IV SOLUTION) ×3 IMPLANT

## 2014-01-11 NOTE — H&P (Signed)
Deborah Bright is an 43 y.o. female. Presented to the office today for a sonohysterogram in an effort to retrieve a fragmented piece of the [place] IUD. She had an IUD placed in 2011 and at time of her annual exam on September 4 as per her request to have it removed a small fragment was retained in the uterine cavity. Is some infusion histogram was performed today of the cervix was cleansed with Betadine solution. A paracervical block with 1% lidocaine was infiltrated at the cervical stroma at the 2, 4, 8, and 10:00 position approximately 10 cc. Several attempts with the [facility] clamp was successful under ultrasound guidance to remove the fragment that was visualized in the lower uterine segment. Patient scheduled to have it removed hysteroscopically today because the patient's pain. Patient on last visit September 1 was treated for bacterial vaginosis with Tindamax.  Pertinent Gynecological History: Menses: regular Bleeding: regular Contraception: IUD DES exposure: denies Blood transfusions: none Sexually transmitted diseases: ? Previous GYN Procedures: no gyn procedure  Last mammogram: ? Date: ? Last pap: result pending Date: 2105 OB History: G0, P0   Menstrual History: Menarche age: 78  Patient's last menstrual period was 12/26/2013.    Past Medical History  Diagnosis Date  . Bronchitis     history  . Seasonal allergies   . Chronic kidney disease     kidney stone  . PONV (postoperative nausea and vomiting)     Past Surgical History  Procedure Laterality Date  . Tonsillectomy  1984  . Breast enhancement surgery  1999    bilat  . Cystoscopy w/ stone manipulation  1994  . Knee arthroscopy  1991    right  . Knee arthroscopy  1992    left  . Mass excision Right 08/17/2012    Procedure: EXCISION OF RIGHT LEG FAT NECROSIS;  Surgeon: Theodoro Kos, DO;  Location: [facility];  Service: Plastics;  Laterality: Right;  . Hip surgery  APRIL 2014    HEMATOMA    Family  History  Problem Relation Age of Onset  . Breast cancer Maternal Grandmother   . Cancer Maternal Grandfather     LUNG   . Cancer Paternal Grandfather     PROSTATE    Social History:  reports that she has quit smoking. She does not have any smokeless tobacco history on file. She reports that she drinks alcohol. She reports that she does not use illicit drugs.  Allergies:  Allergies  Allergen Reactions  . Codeine Nausea And Vomiting    Prescriptions prior to admission  Medication Sig Dispense Refill  . ibuprofen (ADVIL,MOTRIN) 800 MG tablet Take 1 tablet (800 mg total) by mouth every 8 (eight) hours as needed.  30 tablet  1  . loratadine (CLARITIN) 10 MG tablet Take 10 mg by mouth as needed for allergies.      Marland Kitchen tinidazole (TINDAMAX) 500 MG tablet Take four tablets today and four tablets tomorrow at the same time  8 tablet  0    ROS ROS: A ROS was performed and pertinent positives and negatives are included in the history.  GENERAL: No fevers or chills. HEENT: No change in vision, no earache, sore throat or sinus congestion. NECK: No pain or stiffness. CARDIOVASCULAR: No chest pain or pressure. No palpitations. PULMONARY: No shortness of breath, cough or wheeze. GASTROINTESTINAL: No abdominal pain, nausea, vomiting or diarrhea, melena or bright red blood per rectum. GENITOURINARY: No urinary frequency, urgency, hesitancy or dysuria. MUSCULOSKELETAL: No joint or  muscle pain, no back pain, no recent trauma. DERMATOLOGIC: No rash, no itching, no lesions. ENDOCRINE: No polyuria, polydipsia, no heat or cold intolerance. No recent change in weight. HEMATOLOGICAL: No anemia or easy bruising or bleeding. NEUROLOGIC: No headache, seizures, numbness, tingling or weakness. PSYCHIATRIC: No depression, no loss of interest in normal activity or change in sleep pattern.      Last menstrual period 12/26/2013. Physical Exam General appearance : Well developed well nourished female. No acute distress   HEENT: Neck supple, trachea midline, no carotid bruits, no thyroidmegaly  Lungs: Clear to auscultation, no rhonchi or wheezes, or rib retractions  Heart: Regular rate and rhythm, no murmurs or gallops  Breast:Examined in sitting and supine position were symmetrical in appearance, no palpable masses or tenderness, no skin retraction, no nipple inversion, no nipple discharge, no skin discoloration, no axillary or supraclavicular lymphadenopathy  Abdomen: no palpable masses or tenderness, no rebound or guarding  Extremities: no edema or skin discoloration or tenderness  Pelvic:  Bartholin, Urethra, Skene Glands: Within normal limits  Vagina: No lesions or discharge  Cervix: No gross lesions or discharge, IUD string seen  Uterus anteverted, normal size, shape and consistency, non-tender and mobile  Adnexa Without masses or tenderness  Anus and perineum normal  Rectovaginal normal sphincter tone without palpated masses or tenderness  Hemoccult that indicated    Assessment/Plan: Patient scheduled to undergo emergency hysteroscopy to retrieve it fragmented portion of ParaGard IUD. The risks benefits and pros and cons of the operation were discussed with the patient to include the following:                        Patient was counseled as to the risk of surgery to include the following:  1. Infection (prohylactic antibiotics will be administered)  2. DVT/Pulmonary Embolism (prophylactic pneumo compression stockings will be used)  3.Trauma to internal organs requiring additional surgical procedure to repair any injury to     Internal organs requiring perhaps additional hospitalization days.  4.Hemmorhage requiring transfusion and blood products which carry risks such as             anaphylactic reaction, hepatitis and AIDS  Patient had received literature information on the procedure scheduled and all her questions were answered and fully accepts all risk.   [facility] [id number]  AMTD@Note : This dictation was prepared with  Dragon/digital dictation along withSmart phrase technology. Any transcriptional errors that result from this process are unintentional.      Terrance Mass 01/11/2014, 10:39 AM

## 2014-01-11 NOTE — Progress Notes (Signed)
Deborah Bright is an 43 y.o. female. Presented to the office today for a sonohysterogram in an effort to retrieve a fragmented piece of the [place] IUD. She had an IUD placed in 2011 and at time of her annual exam on September 4 as per her request to have it removed a small fragment was retained in the uterine cavity. Is some infusion histogram was performed today of the cervix was cleansed with Betadine solution. A paracervical block with 1% lidocaine was infiltrated at the cervical stroma at the 2, 4, 8, and 10:00 position approximately 10 cc. Several attempts with the [facility] clamp was successful under ultrasound guidance to remove the fragment that was visualized in the lower uterine segment. Patient scheduled to have it removed hysteroscopically today because the patient's pain. Patient on last visit September 1 was treated for bacterial vaginosis with Tindamax.  Pertinent Gynecological History: Menses: regular Bleeding: regular Contraception: IUD DES exposure: denies Blood transfusions: none Sexually transmitted diseases: ? Previous GYN Procedures: no gyn procedure  Last mammogram: ? Date: ? Last pap: result pending Date: 2105 OB History: G0, P0   Menstrual History: Menarche age: 57  Patient's last menstrual period was 12/26/2013.    Past Medical History  Diagnosis Date  . Bronchitis     history  . Seasonal allergies   . Chronic kidney disease     kidney stone  . PONV (postoperative nausea and vomiting)     Past Surgical History  Procedure Laterality Date  . Tonsillectomy  1984  . Breast enhancement surgery  1999    bilat  . Cystoscopy w/ stone manipulation  1994  . Knee arthroscopy  1991    right  . Knee arthroscopy  1992    left  . Mass excision Right 08/17/2012    Procedure: EXCISION OF RIGHT LEG FAT NECROSIS;  Surgeon: Theodoro Kos, DO;  Location: [place];  Service: Plastics;  Laterality: Right;  . Hip surgery  APRIL 2014    HEMATOMA    Family  History  Problem Relation Age of Onset  . Breast cancer Maternal Grandmother   . Cancer Maternal Grandfather     LUNG   . Cancer Paternal Grandfather     PROSTATE    Social History:  reports that she has quit smoking. She does not have any smokeless tobacco history on file. She reports that she drinks alcohol. She reports that she does not use illicit drugs.  Allergies:  Allergies  Allergen Reactions  . Codeine Nausea And Vomiting    Prescriptions prior to admission  Medication Sig Dispense Refill  . ibuprofen (ADVIL,MOTRIN) 800 MG tablet Take 1 tablet (800 mg total) by mouth every 8 (eight) hours as needed.  30 tablet  1  . loratadine (CLARITIN) 10 MG tablet Take 10 mg by mouth as needed for allergies.      Marland Kitchen tinidazole (TINDAMAX) 500 MG tablet Take four tablets today and four tablets tomorrow at the same time  8 tablet  0    ROS ROS: A ROS was performed and pertinent positives and negatives are included in the history.  GENERAL: No fevers or chills. HEENT: No change in vision, no earache, sore throat or sinus congestion. NECK: No pain or stiffness. CARDIOVASCULAR: No chest pain or pressure. No palpitations. PULMONARY: No shortness of breath, cough or wheeze. GASTROINTESTINAL: No abdominal pain, nausea, vomiting or diarrhea, melena or bright red blood per rectum. GENITOURINARY: No urinary frequency, urgency, hesitancy or dysuria. MUSCULOSKELETAL: No joint or  muscle pain, no back pain, no recent trauma. DERMATOLOGIC: No rash, no itching, no lesions. ENDOCRINE: No polyuria, polydipsia, no heat or cold intolerance. No recent change in weight. HEMATOLOGICAL: No anemia or easy bruising or bleeding. NEUROLOGIC: No headache, seizures, numbness, tingling or weakness. PSYCHIATRIC: No depression, no loss of interest in normal activity or change in sleep pattern.      Last menstrual period 12/26/2013. Physical Exam General appearance : Well developed well nourished female. No acute distress   HEENT: Neck supple, trachea midline, no carotid bruits, no thyroidmegaly  Lungs: Clear to auscultation, no rhonchi or wheezes, or rib retractions  Heart: Regular rate and rhythm, no murmurs or gallops  Breast:Examined in sitting and supine position were symmetrical in appearance, no palpable masses or tenderness, no skin retraction, no nipple inversion, no nipple discharge, no skin discoloration, no axillary or supraclavicular lymphadenopathy  Abdomen: no palpable masses or tenderness, no rebound or guarding  Extremities: no edema or skin discoloration or tenderness  Pelvic:  Bartholin, Urethra, Skene Glands: Within normal limits  Vagina: No lesions or discharge  Cervix: No gross lesions or discharge, IUD string seen  Uterus anteverted, normal size, shape and consistency, non-tender and mobile  Adnexa Without masses or tenderness  Anus and perineum normal  Rectovaginal normal sphincter tone without palpated masses or tenderness  Hemoccult that indicated    Assessment/Plan: Patient scheduled to undergo emergency hysteroscopy to retrieve it fragmented portion of ParaGard IUD. The risks benefits and pros and cons of the operation were discussed with the patient to include the following:                        Patient was counseled as to the risk of surgery to include the following:  1. Infection (prohylactic antibiotics will be administered)  2. DVT/Pulmonary Embolism (prophylactic pneumo compression stockings will be used)  3.Trauma to internal organs requiring additional surgical procedure to repair any injury to     Internal organs requiring perhaps additional hospitalization days.  4.Hemmorhage requiring transfusion and blood products which carry risks such as             anaphylactic reaction, hepatitis and AIDS  Patient had received literature information on the procedure scheduled and all her questions were answered and fully accepts all risk.   [facility] HMD10:51  AMTD@Note : This dictation was prepared with  Dragon/digital dictation along withSmart phrase technology. Any transcriptional errors that result from this process are unintentional.      Uvaldo Rising H 01/11/2014, 10:51 AM

## 2014-01-11 NOTE — Anesthesia Postprocedure Evaluation (Signed)
Anesthesia Post Note  Patient: Deborah Bright  Procedure(s) Performed: Procedure(s) (LRB): DILATATION AND CURETTAGE /HYSTEROSCOPY WITH REMOVAL OF IUD FRAGMENT (N/A)  Anesthesia type: General  Patient location: PACU  Post pain: Pain level controlled  Post assessment: Post-op Vital signs reviewed  Last Vitals:  Filed Vitals:   01/11/14 1330  BP: 102/76  Pulse: 64  Temp:   Resp: 44    Post vital signs: Reviewed  Level of consciousness: sedated  Complications: No apparent anesthesia complications

## 2014-01-11 NOTE — Addendum Note (Signed)
Addended by: Terrance Mass on: 01/11/2014 10:55 AM   Modules accepted: Level of Service

## 2014-01-11 NOTE — Anesthesia Preprocedure Evaluation (Signed)
Anesthesia Evaluation  Patient identified by MRN, date of birth, ID band Patient awake    Reviewed: Allergy & Precautions, H&P , NPO status , Patient's Chart, lab work & pertinent test results, reviewed documented beta blocker date and time   Airway Mallampati: I TM Distance: >3 FB Neck ROM: full    Dental no notable dental hx. (+) Teeth Intact   Pulmonary neg pulmonary ROS, former smoker,    Pulmonary exam normal       Cardiovascular negative cardio ROS      Neuro/Psych negative neurological ROS  negative psych ROS   GI/Hepatic negative GI ROS, Neg liver ROS,   Endo/Other  negative endocrine ROS  Renal/GU      Musculoskeletal   Abdominal Normal abdominal exam  (+)   Peds  Hematology negative hematology ROS (+)   Anesthesia Other Findings   Reproductive/Obstetrics negative OB ROS                           Anesthesia Physical Anesthesia Plan  ASA: II and emergent  Anesthesia Plan: General   Post-op Pain Management:    Induction: Intravenous  Airway Management Planned: LMA  Additional Equipment:   Intra-op Plan:   Post-operative Plan:   Informed Consent: I have reviewed the patients History and Physical, chart, labs and discussed the procedure including the risks, benefits and alternatives for the proposed anesthesia with the patient or authorized representative who has indicated his/her understanding and acceptance.     Plan Discussed with: CRNA and Surgeon  Anesthesia Plan Comments:         Anesthesia Quick Evaluation

## 2014-01-11 NOTE — MAU Note (Signed)
Pt presents to mau to prepare for surgery to remove IUD fragment from her uterus

## 2014-01-11 NOTE — Op Note (Signed)
   Operative Note  01/11/2014  1:59 PM  PATIENT:  Deborah Bright  43 y.o. female  PRE-OPERATIVE DIAGNOSIS:  removal fragmented iud   POST-OPERATIVE DIAGNOSIS:  removal fragmented iud   PROCEDURE:  Procedure(s): DILATATION AND CURETTAGE /HYSTEROSCOPY WITH REMOVAL OF IUD FRAGMENT  SURGEON:  Surgeon(s): Terrance Mass, MD  ANESTHESIA:   general  FINDINGS: Dislodged fragmented IUD piece  DESCRIPTION OF OPERATION: The patient was taken to the operating room where she underwent a successful general endotracheal anesthesia. Before starting the operation a time out was undertaken to properly identified the patient in place to allow procedure to be undertaken. She did receive 2 g of Cefotan IV for prophylaxis. She also had PAS stockings for DVT prophylaxis. She was placed in the high lithotomy position the vagina and perineum were prepped and draped in usual sterile fashion. A red Robison catheter was introduced into the bladder to evacuate its contents for approximately 75 cc. Exam under anesthesia demonstrated anteverted uterus normal size shape and consistency with no palpable adnexal masses. A single-tooth tenaculum was placed on the anterior cervical lip. The uterus was sounded to 7/2 cm. Pratt dilators were used to size 21. 2.9 mm operative hysteroscope was introduced into the uterine cavity and normal saline was the distending media. Systematic inspection of the entire uterine cavity and cervix did not demonstrate any foreign body. After blunt curettage was undertaken. The scope was reinserted and still the fragmented IUD was not seen. Following this an 8 mm suction catheter was introduced into the uterine cavity to clean out the entire endometrium. The hysteroscope was reinserted and the fragmented piece became evident and was grasped with a hysteroscopic forcep retrieved. Patient was awakened and transferred to the recovery room with stable vital signs. The fragmented piece was placed in a cup  and given her spouse in the waiting room.  ESTIMATED BLOOD LOSS: Minimal  Intake/Output Summary (Last 24 hours) at 01/11/14 1359 Last data filed at 01/11/14 1254  Gross per 24 hour  Intake    800 ml  Output     80 ml  Net    720 ml     BLOOD ADMINISTERED:none   LOCAL MEDICATIONS USED:  NONE  SPECIMEN:  Source of Specimen:  Intrauterine fragmented IUD piece  DISPOSITION OF SPECIMEN:  Given to the patient's husband  COUNTS:  YES  PLAN OF CARE: Transfer to PACU  [facility] HMD1:59 PMTD@  Note: This dictation was prepared with  Dragon/digital dictation along withSmart phrase technology. Any transcriptional errors that result from this process are unintentional.

## 2014-01-11 NOTE — Transfer of Care (Signed)
Immediate Anesthesia Transfer of Care Note  Patient: Deborah Bright  Procedure(s) Performed: Procedure(s): DILATATION AND CURETTAGE /HYSTEROSCOPY WITH REMOVAL OF IUD FRAGMENT (N/A)  Patient Location: PACU  Anesthesia Type:General  Level of Consciousness: awake, alert  and oriented  Airway & Oxygen Therapy: Patient Spontanous Breathing and Patient connected to nasal cannula oxygen  Post-op Assessment: Report given to PACU RN and Post -op Vital signs reviewed and stable  Post vital signs: Reviewed and stable  Complications: No apparent anesthesia complications

## 2014-01-11 NOTE — Discharge Instructions (Signed)
DISCHARGE INSTRUCTIONS: D&C  The following instructions have been prepared to help you care for yourself upon your return home.  MAY TAKE IBUPROFEN (MOTRIN, ADVIL) OR ALEVE AFTER 5:00 PM FOR CRAMPS, IF NEEDED!!!   Personal hygiene:  Use sanitary pads for vaginal drainage, not tampons.  Shower the day after your procedure.  NO tub baths, pools or Jacuzzis for 2-3 weeks.  Wipe front to back after using the bathroom.  Activity and limitations:  Do NOT drive or operate any equipment for 24 hours. The effects of anesthesia are still present and drowsiness may result.  Do NOT rest in bed all day.  Walking is encouraged.  Walk up and down stairs slowly.  You may resume your normal activity in one to two days or as indicated by your physician.  Sexual activity: NO intercourse for at least 2 weeks after the procedure, or as indicated by your physician.  Diet: Eat a light meal as desired this evening. You may resume your usual diet tomorrow.  Return to work: You may resume your work activities in one to two days or as indicated by your doctor.  What to expect after your surgery: Expect to have vaginal bleeding/discharge for 2-3 days and spotting for up to 10 days. It is not unusual to have soreness for up to 1-2 weeks. You may have a slight burning sensation when you urinate for the first day. Mild cramps may continue for a couple of days. You may have a regular period in 2-6 weeks.  Call your doctor for any of the following:  Excessive vaginal bleeding, saturating and changing one pad every hour.  Inability to urinate 6 hours after discharge from hospital.  Pain not relieved by pain medication.  Fever of 100.4 F or greater.  Unusual vaginal discharge or odor.   Call for an appointment:    Patients signature: ______________________  Nurses signature ________________________  Support person's signature_______________________

## 2014-01-14 ENCOUNTER — Encounter: Payer: Self-pay | Admitting: Gynecology

## 2014-01-14 ENCOUNTER — Encounter (HOSPITAL_COMMUNITY): Payer: Self-pay | Admitting: Gynecology

## 2014-02-04 ENCOUNTER — Other Ambulatory Visit: Payer: 59

## 2014-02-04 ENCOUNTER — Ambulatory Visit: Payer: 59 | Admitting: Gynecology

## 2014-02-26 ENCOUNTER — Other Ambulatory Visit: Payer: Self-pay

## 2014-02-26 DIAGNOSIS — Z1231 Encounter for screening mammogram for malignant neoplasm of breast: Secondary | ICD-10-CM

## 2014-03-18 ENCOUNTER — Ambulatory Visit
Admission: RE | Admit: 2014-03-18 | Discharge: 2014-03-18 | Disposition: A | Discharge status: Home / Self Care | Payer: 59 | Source: Ambulatory Visit

## 2014-03-18 DIAGNOSIS — Z1231 Encounter for screening mammogram for malignant neoplasm of breast: Secondary | ICD-10-CM

## 2015-03-20 ENCOUNTER — Other Ambulatory Visit: Payer: Self-pay

## 2015-03-20 DIAGNOSIS — Z1231 Encounter for screening mammogram for malignant neoplasm of breast: Secondary | ICD-10-CM

## 2015-04-16 ENCOUNTER — Encounter: Payer: Self-pay | Admitting: Gynecology

## 2015-04-30 ENCOUNTER — Ambulatory Visit
Admission: RE | Admit: 2015-04-30 | Discharge: 2015-04-30 | Disposition: A | Discharge status: Home / Self Care | Payer: 59 | Source: Ambulatory Visit

## 2015-04-30 ENCOUNTER — Ambulatory Visit: Payer: Self-pay

## 2015-04-30 DIAGNOSIS — Z1231 Encounter for screening mammogram for malignant neoplasm of breast: Secondary | ICD-10-CM

## 2015-05-04 HISTORY — PX: CARPAL TUNNEL RELEASE: SHX101

## 2016-05-12 ENCOUNTER — Encounter (HOSPITAL_COMMUNITY): Payer: Self-pay | Admitting: Emergency Medicine

## 2016-05-12 ENCOUNTER — Ambulatory Visit (HOSPITAL_COMMUNITY)
Admission: EM | Admit: 2016-05-12 | Discharge: 2016-05-12 | Disposition: A | Discharge status: Other Acute Inpatient Hospital | Payer: 59

## 2016-05-12 ENCOUNTER — Emergency Department (HOSPITAL_COMMUNITY)
Admission: EM | Admit: 2016-05-12 | Discharge: 2016-05-12 | Disposition: A | Discharge status: Home / Self Care | Payer: 59 | Attending: Emergency Medicine | Admitting: Emergency Medicine

## 2016-05-12 ENCOUNTER — Emergency Department (HOSPITAL_COMMUNITY): Payer: 59

## 2016-05-12 DIAGNOSIS — R1031 Right lower quadrant pain: Secondary | ICD-10-CM | POA: Diagnosis present

## 2016-05-12 DIAGNOSIS — Z79899 Other long term (current) drug therapy: Secondary | ICD-10-CM | POA: Insufficient documentation

## 2016-05-12 DIAGNOSIS — N132 Hydronephrosis with renal and ureteral calculous obstruction: Secondary | ICD-10-CM | POA: Insufficient documentation

## 2016-05-12 DIAGNOSIS — N189 Chronic kidney disease, unspecified: Secondary | ICD-10-CM | POA: Insufficient documentation

## 2016-05-12 DIAGNOSIS — Z7982 Long term (current) use of aspirin: Secondary | ICD-10-CM | POA: Diagnosis not present

## 2016-05-12 DIAGNOSIS — N200 Calculus of kidney: Secondary | ICD-10-CM

## 2016-05-12 DIAGNOSIS — Z87891 Personal history of nicotine dependence: Secondary | ICD-10-CM | POA: Insufficient documentation

## 2016-05-12 LAB — URINALYSIS, ROUTINE W REFLEX MICROSCOPIC
BACTERIA UA: NONE SEEN
Bilirubin Urine: NEGATIVE
Glucose, UA: NEGATIVE mg/dL
Ketones, ur: NEGATIVE mg/dL
Leukocytes, UA: NEGATIVE
Nitrite: NEGATIVE
Protein, ur: NEGATIVE mg/dL
Specific Gravity, Urine: 1.015 (ref 1.005–1.030)
pH: 7 (ref 5.0–8.0)

## 2016-05-12 LAB — CBC
HEMATOCRIT: 40.6 % (ref 36.0–46.0)
HEMOGLOBIN: 13.9 g/dL (ref 12.0–15.0)
MCH: 31.4 pg (ref 26.0–34.0)
MCHC: 34.2 g/dL (ref 30.0–36.0)
MCV: 91.6 fL (ref 78.0–100.0)
Platelets: 247 10*3/uL (ref 150–400)
RBC: 4.43 MIL/uL (ref 3.87–5.11)
RDW: 12.6 % (ref 11.5–15.5)
WBC: 9.9 10*3/uL (ref 4.0–10.5)

## 2016-05-12 LAB — COMPREHENSIVE METABOLIC PANEL
ALT: 26 U/L (ref 14–54)
AST: 25 U/L (ref 15–41)
Albumin: 4.3 g/dL (ref 3.5–5.0)
Alkaline Phosphatase: 55 U/L (ref 38–126)
Anion gap: 6 (ref 5–15)
BUN: 17 mg/dL (ref 6–20)
CHLORIDE: 107 mmol/L (ref 101–111)
CO2: 25 mmol/L (ref 22–32)
Calcium: 8.8 mg/dL — ABNORMAL LOW (ref 8.9–10.3)
Creatinine, Ser: 0.77 mg/dL (ref 0.44–1.00)
GFR calc non Af Amer: >60 mL/min (ref >60)
Glucose, Bld: 96 mg/dL (ref 65–99)
POTASSIUM: 4.2 mmol/L (ref 3.5–5.1)
SODIUM: 138 mmol/L (ref 135–145)
Total Bilirubin: 0.7 mg/dL (ref 0.3–1.2)
Total Protein: 6.6 g/dL (ref 6.5–8.1)

## 2016-05-12 LAB — POC URINE PREG, ED: Preg Test, Ur: NEGATIVE

## 2016-05-12 LAB — LIPASE, BLOOD: LIPASE: 29 U/L (ref 11–51)

## 2016-05-12 MED ORDER — OXYCODONE-ACETAMINOPHEN 5-325 MG PO TABS
2.0000 | ORAL_TABLET | Freq: Once | ORAL | Status: AC
  Administered 2016-05-12: 2 via ORAL
  Filled 2016-05-12: qty 2

## 2016-05-12 MED ORDER — ONDANSETRON 4 MG PO TBDP: 4.0000 mg | ORAL_TABLET | Freq: Three times a day (TID) | ORAL | 0 refills | Status: DC | PRN

## 2016-05-12 MED ORDER — TAMSULOSIN HCL 0.4 MG PO CAPS: 0.4000 mg | ORAL_CAPSULE | Freq: Every day | ORAL | 0 refills | Status: DC

## 2016-05-12 MED ORDER — OXYCODONE-ACETAMINOPHEN 5-325 MG PO TABS: 1.0000 | ORAL_TABLET | Freq: Four times a day (QID) | ORAL | 0 refills | Status: DC | PRN

## 2016-05-12 MED ORDER — IBUPROFEN 600 MG PO TABS: 600.0000 mg | ORAL_TABLET | Freq: Four times a day (QID) | ORAL | 0 refills | Status: DC | PRN

## 2016-05-12 MED ORDER — IBUPROFEN 800 MG PO TABS
800.0000 mg | ORAL_TABLET | Freq: Once | ORAL | Status: AC
  Administered 2016-05-12: 800 mg via ORAL
  Filled 2016-05-12: qty 1

## 2016-05-12 MED ORDER — KETOROLAC TROMETHAMINE 60 MG/2ML IM SOLN: 60.0000 mg | Freq: Once | INTRAMUSCULAR | Status: DC | PRN

## 2016-05-12 NOTE — ED Triage Notes (Signed)
Patient reports intermittent sharp RLQ pain radiating to back x1 day. C/o nausea but denies V/D. Hx kidney stones. Denies urinary symptoms, chest pain and SOB.

## 2016-05-12 NOTE — ED Notes (Signed)
Patient was alert, oriented and stable upon discharge. RN went over AVS and patient had no further questions.  

## 2016-05-12 NOTE — ED Provider Notes (Signed)
[facility] DEPT Provider Note   CSN: [id number] Arrival date & time: 05/12/16  1908   By signing my name below, I, Deborah Bright, attest that this documentation has been prepared under the direction and in the presence of [employer], PA-C. Electronically Signed: Eunice Bright, Scribe. 05/12/16. 9:41 PM.    History   Chief Complaint Chief Complaint  Patient presents with  . Abdominal Pain    The history is provided by medical records and the patient. No language interpreter was used.    HPI Comments: Deborah Bright is a 46 y.o. female with past Hx of kidney disease an abdominal procedure who presents to the Emergency Department complaining of progressively worsening sudden onset, episodic RLQ pain pain today. Pain subsided on evaluation States her pain is dull, triage notes sharp pain and she notes her pain felt "like ovulating" cramping yesterday. Further reports her pain radiates into her back, and she notes 2 episodes of pain today. Notes 1/10 pain with her first episode and 10/10 with the second. She reports associated nausea. Further notes she is on her period currently, so she is unsure if she is associated blood in her urine. States her last kidney stone was > 20 years ago, removed via basket retrieval out of town. Seen today at [facility] UC today for same, but they referred her to [facility] ED for [place]. States she still has her appendix.  Pt denies urinary symptoms, chest pain, SOB, incontinence, lower extremity pain and V/D. No local urologist noted.   Past Medical History:  Diagnosis Date  . Bronchitis    history  . Chronic kidney disease    kidney stone  . PONV (postoperative nausea and vomiting)   . Seasonal allergies     Patient Active Problem List   Diagnosis Date Noted  . Weight gain 01/04/2014  . Other mechanical complication of intrauterine contraceptive device 01/04/2014  . Lipoma 08/17/2012    Past Surgical History:  Procedure Laterality Date  . BREAST  ENHANCEMENT SURGERY  1999   bilat  . CYSTOSCOPY W/ STONE MANIPULATION  1994  . HIP SURGERY  APRIL 2014   HEMATOMA  . HYSTEROSCOPY W/D&C N/A 01/11/2014   Procedure: DILATATION AND CURETTAGE /HYSTEROSCOPY WITH REMOVAL OF IUD FRAGMENT;  Surgeon: Terrance Mass, MD;  Location: [facility];  Service: Gynecology;  Laterality: N/A;  . KNEE ARTHROSCOPY  1991   right  . KNEE ARTHROSCOPY  1992   left  . MASS EXCISION Right 08/17/2012   Procedure: EXCISION OF RIGHT LEG FAT NECROSIS;  Surgeon: Theodoro Kos, DO;  Location: [facility];  Service: Plastics;  Laterality: Right;  . TONSILLECTOMY  1984    OB History    Gravida Para Term Preterm AB Living   0             SAB TAB Ectopic Multiple Live Births                   Home Medications    Prior to Admission medications   Medication Sig Start Date End Date Taking? Authorizing Provider  aspirin-acetaminophen-caffeine (EXCEDRIN MIGRAINE) [phone] MG tablet Take 2 tablets by mouth every 6 (six) hours as needed for headache.   Yes Historical Provider, MD  naproxen sodium (ANAPROX) 220 MG tablet Take 220 mg by mouth 2 (two) times daily with a meal.   Yes Historical Provider, MD  traMADol (ULTRAM) 50 MG tablet Take 50 mg by mouth every 6 (six) hours  as needed for moderate pain.   Yes Historical Provider, MD  ibuprofen (ADVIL,MOTRIN) 600 MG tablet Take 1 tablet (600 mg total) by mouth every 6 (six) hours as needed. 05/12/16   [name], PA-C  ketorolac (TORADOL) 10 MG tablet Take 1 tablet (10 mg total) by mouth every 6 (six) hours as needed. Patient not taking: Reported on 05/12/2016 01/11/14   Terrance Mass, MD  ondansetron (ZOFRAN ODT) 4 MG disintegrating tablet Take 1 tablet (4 mg total) by mouth every 8 (eight) hours as needed for nausea or vomiting. 05/12/16   [name], PA-C  oxyCODONE-acetaminophen (PERCOCET/ROXICET) 5-325 MG tablet Take 1-2 tablets by mouth every 6 (six) hours as needed for severe pain. 05/12/16   [name],  PA-C  tamsulosin (FLOMAX) 0.4 MG CAPS capsule Take 1 capsule (0.4 mg total) by mouth daily. 05/12/16   [name], PA-C    Family History Family History  Problem Relation Age of Onset  . Breast cancer Maternal Grandmother   . Cancer Maternal Grandfather     LUNG   . Cancer Paternal Grandfather     PROSTATE    Social History Social History  Substance Use Topics  . Smoking status: Former Research scientist (life sciences)  . Smokeless tobacco: Never Used  . Alcohol use Yes     Comment: rare     Allergies   Codeine and Hydrocodone   Review of Systems Review of Systems  A complete 10 system review of systems was obtained and all systems are negative except as noted in the HPI and PMH.     Physical Exam Updated Vital Signs BP 131/87 (BP Location: Left Arm)   Pulse 78   Temp 98.4 F (36.9 C) (Oral)   Resp 18   Ht 5\' 8"  (1.727 m)   Wt 185 lb (83.9 kg)   LMP 05/12/2016   SpO2 99%   BMI 28.13 kg/m   Physical Exam  Constitutional: She is oriented to person, place, and time. She appears well-developed and well-nourished. No distress.  Patient alert and pleasant; appears comfortable.  HENT:  Head: Normocephalic and atraumatic.  Eyes: Conjunctivae and EOM are normal. No scleral icterus.  Neck: Normal range of motion.  Cardiovascular: Normal rate, regular rhythm and intact distal pulses.   Pulmonary/Chest: Effort normal. No respiratory distress. She has no wheezes.  Respirations even and unlabored  Abdominal: Soft. She exhibits no distension and no mass. There is tenderness. There is no guarding.  Mild right CVA tenderness. There is tenderness to palpation in the right mid abdomen and lower quadrant. No masses or peritoneal signs. No guarding.  Musculoskeletal: Normal range of motion.  Neurological: She is alert and oriented to person, place, and time. She exhibits normal muscle tone. Coordination normal.  Skin: Skin is warm and dry. No rash noted. She is not diaphoretic. No erythema. No pallor.    Psychiatric: She has a normal mood and affect. Her behavior is normal.  Nursing note and vitals reviewed.    ED Treatments / Results  DIAGNOSTIC STUDIES: Oxygen Saturation is 99% on RA, normal by my interpretation.    COORDINATION OF CARE: 12:54 AM Discussed treatment plan with pt at bedside and pt agreed to plan.   Labs (all labs ordered are listed, but only abnormal results are displayed) Labs Reviewed  COMPREHENSIVE METABOLIC PANEL - Abnormal; Notable for the following:       Result Value   Calcium 8.8 (*)    All other components within normal limits  URINALYSIS, ROUTINE  W REFLEX MICROSCOPIC - Abnormal; Notable for the following:    Hgb urine dipstick MODERATE (*)    Squamous Epithelial / LPF 0-5 (*)    All other components within normal limits  LIPASE, BLOOD  CBC  POC URINE PREG, ED    EKG  EKG Interpretation None       Radiology Ct Renal Stone Study  Result Date: 05/12/2016 CLINICAL DATA:  Intermittent right flank pain. History of renal stones. Progressively worsening, sudden onset right lower quadrant pain today. Pain radiates to the back. EXAM: CT ABDOMEN AND PELVIS WITHOUT CONTRAST TECHNIQUE: Multidetector CT imaging of the abdomen and pelvis was performed following the standard protocol without IV contrast. COMPARISON:  10/05/2005 FINDINGS: Lower chest: No acute abnormality. Hepatobiliary: No focal liver abnormality is seen. No gallstones, gallbladder wall thickening, or biliary dilatation. Pancreas: Unremarkable. No pancreatic ductal dilatation or surrounding inflammatory changes. Spleen: Normal in size without focal abnormality. Adrenals/Urinary Tract: No adrenal gland nodules. 4 mm stone in the proximal right ureter at the ureteropelvic junction with proximal hydronephrosis and stranding around the right kidney. Distal ureter is decompressed. No additional discrete stones are demonstrated in either kidney. Left ureter and collecting system are decompressed. Bladder  wall is not thickened. No bladder stones. Stomach/Bowel: Stomach is within normal limits. Appendix appears normal. No evidence of bowel wall thickening, distention, or inflammatory changes. Vascular/Lymphatic: No significant vascular findings are present. No enlarged abdominal or pelvic lymph nodes. Reproductive: Uterus and bilateral adnexa are unremarkable. Other: No abdominal wall hernia or abnormality. No abdominopelvic ascites. Musculoskeletal: No acute or significant osseous findings. IMPRESSION: 4 mm stone in the proximal right ureter with moderate proximal obstruction. Electronically Signed   By: Lucienne Capers M.D.   On: 05/12/2016 22:20    Procedures Procedures (including critical care time)  Medications Ordered in ED Medications  ibuprofen (ADVIL,MOTRIN) tablet 800 mg (800 mg Oral Given 05/12/16 2310)  oxyCODONE-acetaminophen (PERCOCET/ROXICET) 5-325 MG per tablet 2 tablet (2 tablets Oral Given 05/12/16 2310)     Initial Impression / Assessment and Plan / ED Course  I have reviewed the triage vital signs and the nursing notes.  Pertinent labs & imaging results that were available during my care of the patient were reviewed by me and considered in my medical decision making (see chart for details).  Will order imaging and toradol.  Clinical Course     Pt has been diagnosed with a kidney stone via CT. There is no evidence of significant hydronephrosis, serum creatine WNL, vitals sign stable and the pt does not have irratractable vomiting. No worsening pain while in the department; pain controlled with PO meds. Patient declined IM Toradol. Pt will be discharged home with pain medications. She has been referred to urology. Return precautions discussed and provided. Patient discharged in stable condition with no unaddressed concerns.   Final Clinical Impressions(s) / ED Diagnoses   Final diagnoses:  Kidney stone    New Prescriptions Discharge Medication List as of 05/12/2016  10:58 PM    START taking these medications   Details  ondansetron (ZOFRAN ODT) 4 MG disintegrating tablet Take 1 tablet (4 mg total) by mouth every 8 (eight) hours as needed for nausea or vomiting., Starting Wed 05/12/2016, Print    oxyCODONE-acetaminophen (PERCOCET/ROXICET) 5-325 MG tablet Take 1-2 tablets by mouth every 6 (six) hours as needed for severe pain., Starting Wed 05/12/2016, Print    tamsulosin (FLOMAX) 0.4 MG CAPS capsule Take 1 capsule (0.4 mg total) by mouth daily., Starting Wed 05/12/2016,  Print       I personally performed the services described in this documentation, which was scribed in my presence. The recorded information has been reviewed and is accurate.      [name], PA-C 05/13/16 [place], DO 05/13/16 [id number]

## 2016-09-15 ENCOUNTER — Encounter: Payer: Self-pay | Admitting: Gynecology

## 2016-10-31 HISTORY — PX: MOUTH SURGERY: SHX715

## 2016-12-03 ENCOUNTER — Other Ambulatory Visit: Payer: Self-pay | Admitting: Gynecology

## 2016-12-03 DIAGNOSIS — Z1231 Encounter for screening mammogram for malignant neoplasm of breast: Secondary | ICD-10-CM

## 2016-12-10 ENCOUNTER — Ambulatory Visit
Admission: RE | Admit: 2016-12-10 | Discharge: 2016-12-10 | Disposition: A | Discharge status: Home / Self Care | Payer: 59 | Source: Ambulatory Visit | Attending: Gynecology | Admitting: Gynecology

## 2016-12-10 DIAGNOSIS — Z1231 Encounter for screening mammogram for malignant neoplasm of breast: Secondary | ICD-10-CM

## 2017-01-07 ENCOUNTER — Ambulatory Visit (INDEPENDENT_AMBULATORY_CARE_PROVIDER_SITE_OTHER): Payer: 59 | Admitting: Obstetrics & Gynecology

## 2017-01-07 ENCOUNTER — Encounter: Payer: Self-pay | Admitting: Obstetrics & Gynecology

## 2017-01-07 VITALS — BP 124/78 | Ht 66.0 in | Wt 186.0 lb

## 2017-01-07 DIAGNOSIS — Z1151 Encounter for screening for human papillomavirus (HPV): Secondary | ICD-10-CM | POA: Diagnosis not present

## 2017-01-07 DIAGNOSIS — Z9189 Other specified personal risk factors, not elsewhere classified: Secondary | ICD-10-CM

## 2017-01-07 DIAGNOSIS — Z01419 Encounter for gynecological examination (general) (routine) without abnormal findings: Secondary | ICD-10-CM

## 2017-01-07 DIAGNOSIS — N951 Menopausal and female climacteric states: Secondary | ICD-10-CM

## 2017-01-07 NOTE — Progress Notes (Signed)
Deborah Bright 05-Apr-1971 [id number]   History:    46 y.o. G0 Stable boyfriend.  Vasectomy  RP:  Established patient presenting for annual gyn exam   HPI:  Menses reg every month, but decreased flow x 2-3 days only now.  Started having hot flushes and night sweats.  Will try PhytoEstrogens. No pelvic pain.  C/O vulvar lesion/skin tag, occasionally tender.  Past medical history,surgical history, family history and social history were all reviewed and documented in the EPIC chart.  Gynecologic History Patient's last menstrual period was 01/04/2017. Contraception: vasectomy Last Pap: 01/2014. Results were: Negative Last mammogram: 12/2016. Results were: Negative.  Obstetric History OB History  Gravida Para Term Preterm AB Living  0            SAB TAB Ectopic Multiple Live Births                    ROS: A ROS was performed and pertinent positives and negatives are included in the history.  GENERAL: No fevers or chills. HEENT: No change in vision, no earache, sore throat or sinus congestion. NECK: No pain or stiffness. CARDIOVASCULAR: No chest pain or pressure. No palpitations. PULMONARY: No shortness of breath, cough or wheeze. GASTROINTESTINAL: No abdominal pain, nausea, vomiting or diarrhea, melena or bright red blood per rectum. GENITOURINARY: No urinary frequency, urgency, hesitancy or dysuria. MUSCULOSKELETAL: No joint or muscle pain, no back pain, no recent trauma. DERMATOLOGIC: No rash, no itching, no lesions. ENDOCRINE: No polyuria, polydipsia, no heat or cold intolerance. No recent change in weight. HEMATOLOGICAL: No anemia or easy bruising or bleeding. NEUROLOGIC: No headache, seizures, numbness, tingling or weakness. PSYCHIATRIC: No depression, no loss of interest in normal activity or change in sleep pattern.     Exam:   BP 124/78   Ht '5\' 6"'  (1.676 m)   Wt 186 lb (84.4 kg)   LMP 01/04/2017   BMI 30.02 kg/m   Body mass index is 30.02 kg/m.  General appearance :  Well developed well nourished female. No acute distress HEENT: Eyes: no retinal hemorrhage or exudates,  Neck supple, trachea midline, no carotid bruits, no thyroidmegaly Lungs: Clear to auscultation, no rhonchi or wheezes, or rib retractions  Heart: Regular rate and rhythm, no murmurs or gallops Breast:Examined in sitting and supine position were symmetrical in appearance, no palpable masses or tenderness,  no skin retraction, no nipple inversion, no nipple discharge, no skin discoloration, no axillary or supraclavicular lymphadenopathy Abdomen: no palpable masses or tenderness, no rebound or guarding Extremities: no edema or skin discoloration or tenderness  Pelvic:  Vulva:  Rt labia minora with small overgrowth of a rugae.  Bartholin, Urethra, Skene Glands: Within normal limits             Vagina: No gross lesions or discharge  Cervix: No gross lesions or discharge.  Pap/HPV HR done.  Uterus  AV, normal size, shape and consistency, non-tender and mobile  Adnexa  Without masses or tenderness  Anus and perineum  normal    Assessment/Plan:  46 y.o. female for annual exam   1. Encounter for routine gynecological examination with Papanicolaou smear of cervix Normal gyn exam.  Pap/HPV done.  Breasts wnl. - PAP,TP IMGw/HPV RNA,rflx [id number],32/95 - TSH; Future - CBC w/Diff; Future - Vitamin D (25 hydroxy); Future - Comp Met (CMET); Future - Lipid Profile; Future  2. Special screening examination for human papillomavirus (HPV)  - PAP,TP IMGw/HPV RNA,rflx [id number],30/16  3. Relies on partner vasectomy  for contraception   4. Hot flushes, perimenopausal Will use PhytoEstrogens As needed.  Still menstruating regularly.  Princess Bruins MD, 3:19 PM 01/07/2017

## 2017-01-10 LAB — PAP, TP IMAGING W/ HPV RNA, RFLX HPV TYPE 16,18/45: HPV DNA High Risk: NOT DETECTED

## 2017-01-10 NOTE — Patient Instructions (Signed)
1. Encounter for routine gynecological examination with Papanicolaou smear of cervix Normal gyn exam.  Pap/HPV done.  Breasts wnl. - PAP,TP IMGw/HPV RNA,rflx [id number],59/16 - TSH; Future - CBC w/Diff; Future - Vitamin D (25 hydroxy); Future - Comp Met (CMET); Future - Lipid Profile; Future  2. Special screening examination for human papillomavirus (HPV)  - PAP,TP IMGw/HPV RNA,rflx [id number],57/01  3. Relies on partner vasectomy for contraception   4. Hot flushes, perimenopausal Will use PhytoEstrogens As needed.  Still menstruating regularly.  Deborah Bright, it was a pleasure to see you today!  I will inform you of your results as soon as available.  Health Maintenance, Female Adopting a healthy lifestyle and getting preventive care can go a long way to promote health and wellness. Talk with your health care provider about what schedule of regular examinations is right for you. This is a good chance for you to check in with your provider about disease prevention and staying healthy. In between checkups, there are plenty of things you can do on your own. Experts have done a lot of research about which lifestyle changes and preventive measures are most likely to keep you healthy. Ask your health care provider for more information. Weight and diet Eat a healthy diet  Be sure to include plenty of vegetables, fruits, low-fat dairy products, and lean protein.  Do not eat a lot of foods high in solid fats, added sugars, or salt.  Get regular exercise. This is one of the most important things you can do for your health. ? Most adults should exercise for at least 150 minutes each week. The exercise should increase your heart rate and make you sweat (moderate-intensity exercise). ? Most adults should also do strengthening exercises at least twice a week. This is in addition to the moderate-intensity exercise.  Maintain a healthy weight  Body mass index (BMI) is a measurement that can be used to  identify possible weight problems. It estimates body fat based on height and weight. Your health care provider can help determine your BMI and help you achieve or maintain a healthy weight.  For females 57 years of age and older: ? A BMI below 18.5 is considered underweight. ? A BMI of 18.5 to 24.9 is normal. ? A BMI of 25 to 29.9 is considered overweight. ? A BMI of 30 and above is considered obese.  Watch levels of cholesterol and blood lipids  You should start having your blood tested for lipids and cholesterol at 47 years of age, then have this test every 5 years.  You may need to have your cholesterol levels checked more often if: ? Your lipid or cholesterol levels are high. ? You are older than 47 years of age. ? You are at high risk for heart disease.  Cancer screening Lung Cancer  Lung cancer screening is recommended for adults 38-19 years old who are at high risk for lung cancer because of a history of smoking.  A yearly low-dose CT scan of the lungs is recommended for people who: ? Currently smoke. ? Have quit within the past 15 years. ? Have at least a 30-pack-year history of smoking. A pack year is smoking an average of one pack of cigarettes a day for 1 year.  Yearly screening should continue until it has been 15 years since you quit.  Yearly screening should stop if you develop a health problem that would prevent you from having lung cancer treatment.  Breast Cancer  Practice breast self-awareness. This means  understanding how your breasts normally appear and feel.  It also means doing regular breast self-exams. Let your health care provider know about any changes, no matter how small.  If you are in your 20s or 30s, you should have a clinical breast exam (CBE) by a health care provider every 1-3 years as part of a regular health exam.  If you are 34 or older, have a CBE every year. Also consider having a breast X-ray (mammogram) every year.  If you have a  family history of breast cancer, talk to your health care provider about genetic screening.  If you are at high risk for breast cancer, talk to your health care provider about having an MRI and a mammogram every year.  Breast cancer gene (BRCA) assessment is recommended for women who have family members with BRCA-related cancers. BRCA-related cancers include: ? Breast. ? Ovarian. ? Tubal. ? Peritoneal cancers.  Results of the assessment will determine the need for genetic counseling and BRCA1 and BRCA2 testing.  Cervical Cancer Your health care provider may recommend that you be screened regularly for cancer of the pelvic organs (ovaries, uterus, and vagina). This screening involves a pelvic examination, including checking for microscopic changes to the surface of your cervix (Pap test). You may be encouraged to have this screening done every 3 years, beginning at age 21.  For women ages 65-65, health care providers may recommend pelvic exams and Pap testing every 3 years, or they may recommend the Pap and pelvic exam, combined with testing for human papilloma virus (HPV), every 5 years. Some types of HPV increase your risk of cervical cancer. Testing for HPV may also be done on women of any age with unclear Pap test results.  Other health care providers may not recommend any screening for nonpregnant women who are considered low risk for pelvic cancer and who do not have symptoms. Ask your health care provider if a screening pelvic exam is right for you.  If you have had past treatment for cervical cancer or a condition that could lead to cancer, you need Pap tests and screening for cancer for at least 20 years after your treatment. If Pap tests have been discontinued, your risk factors (such as having a new sexual partner) need to be reassessed to determine if screening should resume. Some women have medical problems that increase the chance of getting cervical cancer. In these cases, your  health care provider may recommend more frequent screening and Pap tests.  Colorectal Cancer  This type of cancer can be detected and often prevented.  Routine colorectal cancer screening usually begins at 47 years of age and continues through 47 years of age.  Your health care provider may recommend screening at an earlier age if you have risk factors for colon cancer.  Your health care provider may also recommend using home test kits to check for hidden blood in the stool.  A small camera at the end of a tube can be used to examine your colon directly (sigmoidoscopy or colonoscopy). This is done to check for the earliest forms of colorectal cancer.  Routine screening usually begins at age 1.  Direct examination of the colon should be repeated every 5-10 years through 47 years of age. However, you may need to be screened more often if early forms of precancerous polyps or small growths are found.  Skin Cancer  Check your skin from head to toe regularly.  Tell your health care provider about any new moles  moles or changes in moles, especially if there is a change in a mole's shape or color.  Also tell your health care provider if you have a mole that is larger than the size of a pencil eraser.  Always use sunscreen. Apply sunscreen liberally and repeatedly throughout the day.  Protect yourself by wearing long sleeves, pants, a wide-brimmed hat, and sunglasses whenever you are outside.  Heart disease, diabetes, and high blood pressure  High blood pressure causes heart disease and increases the risk of stroke. High blood pressure is more likely to develop in: ? People who have blood pressure in the high end of the normal range (130-139/85-89 mm Hg). ? People who are overweight or obese. ? People who are African American.  If you are 18-39 years of age, have your blood pressure checked every 3-5 years. If you are 40 years of age or older, have your blood pressure checked every year.  You should have your blood pressure measured twice-once when you are at a hospital or clinic, and once when you are not at a hospital or clinic. Record the average of the two measurements. To check your blood pressure when you are not at a hospital or clinic, you can use: ? An automated blood pressure machine at a pharmacy. ? A home blood pressure monitor.  If you are between 55 years and 79 years old, ask your health care provider if you should take aspirin to prevent strokes.  Have regular diabetes screenings. This involves taking a blood sample to check your fasting blood sugar level. ? If you are at a normal weight and have a low risk for diabetes, have this test once every three years after 47 years of age. ? If you are overweight and have a high risk for diabetes, consider being tested at a younger age or more often. Preventing infection Hepatitis B  If you have a higher risk for hepatitis B, you should be screened for this virus. You are considered at high risk for hepatitis B if: ? You were born in a country where hepatitis B is common. Ask your health care provider which countries are considered high risk. ? Your parents were born in a high-risk country, and you have not been immunized against hepatitis B (hepatitis B vaccine). ? You have HIV or AIDS. ? You use needles to inject street drugs. ? You live with someone who has hepatitis B. ? You have had sex with someone who has hepatitis B. ? You get hemodialysis treatment. ? You take certain medicines for conditions, including cancer, organ transplantation, and autoimmune conditions.  Hepatitis C  Blood testing is recommended for: ? Everyone born from 1945 through 1965. ? Anyone with known risk factors for hepatitis C.  Sexually transmitted infections (STIs)  You should be screened for sexually transmitted infections (STIs) including gonorrhea and chlamydia if: ? You are sexually active and are younger than 47 years of  age. ? You are older than 47 years of age and your health care provider tells you that you are at risk for this type of infection. ? Your sexual activity has changed since you were last screened and you are at an increased risk for chlamydia or gonorrhea. Ask your health care provider if you are at risk.  If you do not have HIV, but are at risk, it may be recommended that you take a prescription medicine daily to prevent HIV infection. This is called pre-exposure prophylaxis (PrEP). You are considered at risk   if: ? You are sexually active and do not regularly use condoms or know the HIV status of your partner(s). ? You take drugs by injection. ? You are sexually active with a partner who has HIV.  Talk with your health care provider about whether you are at high risk of being infected with HIV. If you choose to begin PrEP, you should first be tested for HIV. You should then be tested every 3 months for as long as you are taking PrEP. Pregnancy  If you are premenopausal and you may become pregnant, ask your health care provider about preconception counseling.  If you may become pregnant, take 400 to 800 micrograms (mcg) of folic acid every day.  If you want to prevent pregnancy, talk to your health care provider about birth control (contraception). Osteoporosis and menopause  Osteoporosis is a disease in which the bones lose minerals and strength with aging. This can result in serious bone fractures. Your risk for osteoporosis can be identified using a bone density scan.  If you are 65 years of age or older, or if you are at risk for osteoporosis and fractures, ask your health care provider if you should be screened.  Ask your health care provider whether you should take a calcium or vitamin D supplement to lower your risk for osteoporosis.  Menopause may have certain physical symptoms and risks.  Hormone replacement therapy may reduce some of these symptoms and risks. Talk to your health  care provider about whether hormone replacement therapy is right for you. Follow these instructions at home:  Schedule regular health, dental, and eye exams.  Stay current with your immunizations.  Do not use any tobacco products including cigarettes, chewing tobacco, or electronic cigarettes.  If you are pregnant, do not drink alcohol.  If you are breastfeeding, limit how much and how often you drink alcohol.  Limit alcohol intake to no more than 1 drink per day for nonpregnant women. One drink equals 12 ounces of beer, 5 ounces of wine, or 1 ounces of hard liquor.  Do not use street drugs.  Do not share needles.  Ask your health care provider for help if you need support or information about quitting drugs.  Tell your health care provider if you often feel depressed.  Tell your health care provider if you have ever been abused or do not feel safe at home. This information is not intended to replace advice given to you by your health care provider. Make sure you discuss any questions you have with your health care provider. Document Released: 11/02/2010 Document Revised: 09/25/2015 Document Reviewed: 01/21/2015 Elsevier Interactive Patient Education  2018 [employer]  

## 2017-01-12 ENCOUNTER — Encounter: Payer: Self-pay | Admitting: *Deleted

## 2017-11-15 ENCOUNTER — Other Ambulatory Visit: Payer: Self-pay | Admitting: Obstetrics & Gynecology

## 2017-11-15 DIAGNOSIS — Z1231 Encounter for screening mammogram for malignant neoplasm of breast: Secondary | ICD-10-CM

## 2017-12-16 ENCOUNTER — Ambulatory Visit: Payer: 59

## 2017-12-30 ENCOUNTER — Ambulatory Visit
Admission: RE | Admit: 2017-12-30 | Discharge: 2017-12-30 | Disposition: A | Discharge status: Home / Self Care | Payer: 59 | Source: Ambulatory Visit | Attending: Obstetrics & Gynecology | Admitting: Obstetrics & Gynecology

## 2017-12-30 DIAGNOSIS — Z1231 Encounter for screening mammogram for malignant neoplasm of breast: Secondary | ICD-10-CM

## 2018-03-01 ENCOUNTER — Ambulatory Visit: Payer: 59 | Admitting: Obstetrics & Gynecology

## 2018-03-01 ENCOUNTER — Encounter: Payer: Self-pay | Admitting: Obstetrics & Gynecology

## 2018-03-01 VITALS — BP 108/72 | Ht 66.25 in | Wt 186.0 lb

## 2018-03-01 DIAGNOSIS — Z01419 Encounter for gynecological examination (general) (routine) without abnormal findings: Secondary | ICD-10-CM | POA: Diagnosis not present

## 2018-03-01 DIAGNOSIS — N951 Menopausal and female climacteric states: Secondary | ICD-10-CM

## 2018-03-01 DIAGNOSIS — Z9189 Other specified personal risk factors, not elsewhere classified: Secondary | ICD-10-CM

## 2018-03-01 MED ORDER — PROGESTERONE MICRONIZED 100 MG PO CAPS: 100.0000 mg | ORAL_CAPSULE | Freq: Every day | ORAL | 4 refills | Status: DC

## 2018-03-01 NOTE — Progress Notes (Signed)
Deborah Bright May 02, 1971 [id number]   History:    47 y.o. G0 Engaged.    RP:  Established patient presenting for annual gyn exam   HPI: Menses light usually monthly, skipped 1 month x last year.  Mild hot flushes/night sweats same as last year.  Tends to wake up during the night and difficulty falling asleep again.    Past medical history,surgical history, family history and social history were all reviewed and documented in the EPIC chart.  Gynecologic History Patient's last menstrual period was 02/12/2018. Contraception: vasectomy Last Pap: 01/2017. Results were: Negative/HPV HR negative Last mammogram: 12/2017. Results were: Negative Bone Density: Never Colonoscopy: Never  Obstetric History OB History  Gravida Para Term Preterm AB Living  0            SAB TAB Ectopic Multiple Live Births                ROS: A ROS was performed and pertinent positives and negatives are included in the history.  GENERAL: No fevers or chills. HEENT: No change in vision, no earache, sore throat or sinus congestion. NECK: No pain or stiffness. CARDIOVASCULAR: No chest pain or pressure. No palpitations. PULMONARY: No shortness of breath, cough or wheeze. GASTROINTESTINAL: No abdominal pain, nausea, vomiting or diarrhea, melena or bright red blood per rectum. GENITOURINARY: No urinary frequency, urgency, hesitancy or dysuria. MUSCULOSKELETAL: No joint or muscle pain, no back pain, no recent trauma. DERMATOLOGIC: No rash, no itching, no lesions. ENDOCRINE: No polyuria, polydipsia, no heat or cold intolerance. No recent change in weight. HEMATOLOGICAL: No anemia or easy bruising or bleeding. NEUROLOGIC: No headache, seizures, numbness, tingling or weakness. PSYCHIATRIC: No depression, no loss of interest in normal activity or change in sleep pattern.     Exam:   BP 108/72   Ht 5' 6.25" (1.683 m)   Wt 186 lb (84.4 kg)   LMP 02/12/2018   BMI 29.80 kg/m   Body mass index is 29.8 kg/m.  General  appearance : Well developed well nourished female. No acute distress HEENT: Eyes: no retinal hemorrhage or exudates,  Neck supple, trachea midline, no carotid bruits, no thyroidmegaly Lungs: Clear to auscultation, no rhonchi or wheezes, or rib retractions  Heart: Regular rate and rhythm, no murmurs or gallops Breast:Examined in sitting and supine position were symmetrical in appearance, no palpable masses or tenderness,  no skin retraction, no nipple inversion, no nipple discharge, no skin discoloration, no axillary or supraclavicular lymphadenopathy Abdomen: no palpable masses or tenderness, no rebound or guarding Extremities: no edema or skin discoloration or tenderness  Pelvic: Vulva: Normal             Vagina: No gross lesions or discharge  Cervix: No gross lesions or discharge  Uterus  AV, normal size, shape and consistency, non-tender and mobile  Adnexa  Without masses or tenderness  Anus: Normal   Assessment/Plan:  47 y.o. female for annual exam   1. Well female exam with routine gynecological exam Normal gynecologic exam.  Last Pap test September 2018 was negative with negative high risk HPV.  Will repeat Pap test at 2 to 3 years.  Breast exam normal.  Last screening mammogram August 2019 was negative.  Health labs with family physician.  2. Relies on partner vasectomy for contraception  3. Perimenopausal Perimenopause with mild vasomotor symptoms.  Complains of insomnia.  Decision to start on micronized progesterone 100 mg capsule 1 capsule daily at bedtime.  Hoping that the progesterone will  help patient sleep and also protect the endometrium.  It may as well help control the mild vasomotor symptoms.  Other orders - progesterone (PROMETRIUM) 100 MG capsule; Take 1 capsule (100 mg total) by mouth at bedtime.  Princess Bruins MD, 11:48 AM 03/01/2018

## 2018-03-03 ENCOUNTER — Encounter: Payer: Self-pay | Admitting: Obstetrics & Gynecology

## 2018-03-03 NOTE — Patient Instructions (Signed)
1. Well female exam with routine gynecological exam Normal gynecologic exam.  Last Pap test September 2018 was negative with negative high risk HPV.  Will repeat Pap test at 2 to 3 years.  Breast exam normal.  Last screening mammogram August 2019 was negative.  Health labs with family physician.  2. Relies on partner vasectomy for contraception  3. Perimenopausal Perimenopause with mild vasomotor symptoms.  Complains of insomnia.  Decision to start on micronized progesterone 100 mg capsule 1 capsule daily at bedtime.  Hoping that the progesterone will help patient sleep and also protect the endometrium.  It may as well help control the mild vasomotor symptoms.  Other orders - progesterone (PROMETRIUM) 100 MG capsule; Take 1 capsule (100 mg total) by mouth at bedtime.  Deborah Bright, it was a pleasure seeing you today!

## 2018-03-13 ENCOUNTER — Ambulatory Visit (HOSPITAL_COMMUNITY)
Admission: EM | Admit: 2018-03-13 | Discharge: 2018-03-13 | Disposition: A | Discharge status: Home / Self Care | Payer: 59 | Attending: Urgent Care | Admitting: Urgent Care

## 2018-03-13 ENCOUNTER — Encounter (HOSPITAL_COMMUNITY): Payer: Self-pay | Admitting: Urgent Care

## 2018-03-13 DIAGNOSIS — L03032 Cellulitis of left toe: Secondary | ICD-10-CM | POA: Diagnosis not present

## 2018-03-13 DIAGNOSIS — M79675 Pain in left toe(s): Secondary | ICD-10-CM

## 2018-03-13 MED ORDER — FLUCONAZOLE 150 MG PO TABS: 150.0000 mg | ORAL_TABLET | ORAL | 0 refills | Status: DC

## 2018-03-13 MED ORDER — CEPHALEXIN 500 MG PO CAPS: 500.0000 mg | ORAL_CAPSULE | Freq: Three times a day (TID) | ORAL | 0 refills | Status: DC

## 2018-03-13 NOTE — Discharge Instructions (Signed)
You may take 500mg  Tylenol with ibuprofen 400-600mg  every 6 hours for pain and inflammation.

## 2018-03-13 NOTE — ED Provider Notes (Signed)
MRN: [id number] DOB: 06/06/1970  Subjective:   Deborah Bright is a 47 y.o. female presenting for 1 week history of persistent, sharp left great toenail.  Symptoms started after she got a pedicure.  She has steadily gotten more redness and pain along the side of her left great toenail.  She has been using Epsom salt soaks at night with some relief.  She is also wearing new boots for her job.  Denies fever, nausea, vomiting, belly pain, drainage of pus or bleeding.  Denies having history of ingrown toenails.  No current facility-administered medications for this encounter.   Current Outpatient Medications:  .  aspirin-acetaminophen-caffeine (EXCEDRIN MIGRAINE) 250-250-65 MG tablet, Take 2 tablets by mouth every 6 (six) hours as needed for headache., Disp: , Rfl:  .  ibuprofen (ADVIL,MOTRIN) 600 MG tablet, Take 1 tablet (600 mg total) by mouth every 6 (six) hours as needed., Disp: 30 tablet, Rfl: 0 .  progesterone (PROMETRIUM) 100 MG capsule, Take 1 capsule (100 mg total) by mouth at bedtime., Disp: 90 capsule, Rfl: 4   Allergies  Allergen Reactions  . Codeine Nausea And Vomiting  . Hydrocodone Itching  . Percocet [Oxycodone-Acetaminophen] Rash    Past Medical History:  Diagnosis Date  . Bronchitis    history  . Chronic kidney disease    kidney stone  . PONV (postoperative nausea and vomiting)   . Seasonal allergies      Past Surgical History:  Procedure Laterality Date  . AUGMENTATION MAMMAPLASTY Bilateral   . BREAST ENHANCEMENT SURGERY  1999   bilat  . CARPAL TUNNEL RELEASE Bilateral 2017   TRIGGER THUMB RELEASE  . CYSTOSCOPY W/ STONE MANIPULATION  1994  . dental implants    . HIP SURGERY  APRIL 2014   HEMATOMA  . HYSTEROSCOPY W/D&C N/A 01/11/2014   Procedure: DILATATION AND CURETTAGE /HYSTEROSCOPY WITH REMOVAL OF IUD FRAGMENT;  Surgeon: Terrance Mass, MD;  Location: [facility];  Service: Gynecology;  Laterality: N/A;  . KNEE ARTHROSCOPY  1991   right  . KNEE ARTHROSCOPY   1992   left  . MASS EXCISION Right 08/17/2012   Procedure: EXCISION OF RIGHT LEG FAT NECROSIS;  Surgeon: Theodoro Kos, DO;  Location: [facility];  Service: Plastics;  Laterality: Right;  . MOUTH SURGERY  10/2016   IMPLANT   . TONSILLECTOMY  1984    Objective:   Vitals: BP 105/76 (BP Location: Left Arm)   Pulse 84   Temp 98.8 F (37.1 C) (Oral)   Resp 16   LMP 02/12/2018   SpO2 95%   Physical Exam  Constitutional: She is oriented to person, place, and time. She appears well-developed and well-nourished.  Cardiovascular: Normal rate.  Pulmonary/Chest: Effort normal.  Musculoskeletal:       Left foot: There is tenderness and swelling.       Feet:  Neurological: She is alert and oriented to person, place, and time.   Assessment and Plan :   Paronychia of great toe of left foot  Great toe pain, left  Counseled patient on procedure for relieving the paronychia including numbing and using instruments to drain the paronychia.  Patient refused due to her fear of needles and use of scalpel or other equipment to work on her paronychia.  We will instead use Keflex, strict ER return to clinic precautions given.  In the meantime schedule Tylenol and ibuprofen, use warm soaks up to 3 times daily.  Work note provided to patient.    Bess Harvest,  Freida Busman, [place] 03/13/18 [phone]

## 2018-03-13 NOTE — ED Notes (Signed)
Bed: UC01 Expected date:  Expected time:  Means of arrival:  Comments: appt 

## 2018-03-13 NOTE — ED Triage Notes (Signed)
Pt sts left great toe pain and redness; pt unsure if has infection; pt sts had pedicure 3 weeks ago

## 2018-05-16 ENCOUNTER — Ambulatory Visit (HOSPITAL_COMMUNITY)
Admission: EM | Admit: 2018-05-16 | Discharge: 2018-05-16 | Disposition: A | Discharge status: Home / Self Care | Payer: 59 | Attending: Family Medicine | Admitting: Family Medicine

## 2018-05-16 ENCOUNTER — Encounter (HOSPITAL_COMMUNITY): Payer: Self-pay | Admitting: Emergency Medicine

## 2018-05-16 DIAGNOSIS — M79644 Pain in right finger(s): Secondary | ICD-10-CM | POA: Diagnosis not present

## 2018-05-16 MED ORDER — PREDNISONE 10 MG (21) PO TBPK: ORAL_TABLET | Freq: Every day | ORAL | 0 refills | Status: DC

## 2018-05-16 NOTE — ED Triage Notes (Signed)
Pt here for right thumb pain and swelling; pt sts injured 2 years ago and had swollen cyst type area that is now red and more swollen

## 2018-05-17 NOTE — ED Provider Notes (Signed)
Deborah Bright   [id number] 05/16/18 Arrival Time: 5397  ASSESSMENT & PLAN:  1. Pain of right thumb    Discussed low yield regarding plain imaging of her thumb this evening. With what appears to be a chronic cyst/mass of palmar R thumb, I recommend that she schedule evaluation with Dr. Amedeo Plenty. She has seen him before. I do not see any signs of infection requiring antibiotics or abscess requiring I&D. Discussed. Suspect inflammatory cause of acute discomfort.  Trial of prednisone to see if we can reduce some of the inflammation thus giving her better use of her thumb.  Meds ordered this encounter  Medications  . predniSONE (STERAPRED UNI-PAK 21 TAB) 10 MG (21) TBPK tablet    Sig: Take by mouth daily. Take as directed.    Dispense:  21 tablet    Refill:  0   Follow-up Information    Schedule an appointment as soon as possible for a visit  with Roseanne Kaufman, MD.   Specialty:  Orthopedic Surgery Contact information: [address] [place] [address] [phone]          Reviewed expectations re: course of current medical issues. Questions answered. Outlined signs and symptoms indicating need for more acute intervention. Patient verbalized understanding. After Visit Summary given.  SUBJECTIVE: History from: patient. Deborah Bright is a 48 y.o. female who reports shutting her R thumb in a car door one year ago. Reports "really bad injury with deep cuts" on both sides of her R thumb. Did not seek medical care. Since then, she has had a cyst/mass on the palmar side of her R thumb that occasionally gets inflamed and causes stiffness. Reports inflammation of this over the past few days. Now limiting mobility of thumb. 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 No fever. No extremity sensation changes or weakness. No specific aggravating or alleviating factors reported. Ibuprofen without much relief. No new injury or trauma reported.  Past Surgical  History:  Procedure Laterality Date  . AUGMENTATION MAMMAPLASTY Bilateral   . BREAST ENHANCEMENT SURGERY  1999   bilat  . CARPAL TUNNEL RELEASE Bilateral 2017   TRIGGER THUMB RELEASE  . CYSTOSCOPY W/ STONE MANIPULATION  1994  . dental implants    . HIP SURGERY  APRIL 2014   HEMATOMA  . HYSTEROSCOPY W/D&C N/A 01/11/2014   Procedure: DILATATION AND CURETTAGE /HYSTEROSCOPY WITH REMOVAL OF IUD FRAGMENT;  Surgeon: Terrance Mass, MD;  Location: [facility];  Service: Gynecology;  Laterality: N/A;  . KNEE ARTHROSCOPY  1991   right  . KNEE ARTHROSCOPY  1992   left  . MASS EXCISION Right 08/17/2012   Procedure: EXCISION OF RIGHT LEG FAT NECROSIS;  Surgeon: Theodoro Kos, DO;  Location: [facility];  Service: Plastics;  Laterality: Right;  . MOUTH SURGERY  10/2016   IMPLANT   . TONSILLECTOMY  1984    ROS: As per HPI. All other systems negative   OBJECTIVE:  Vitals:   05/16/18 1751  BP: 122/74  Pulse: 78  Resp: 18  Temp: 98.1 F (36.7 C)  TempSrc: Oral  SpO2: 96%    General appearance: alert; no distress Extremities: . RUE: warm and well perfused; well localized mild to moderate tenderness over mid right palmar side of her thumb; with a 0.5 cm round area of firmness of mid thumb; with mild swelling; with no bruising; ROM: decreased secondary to swelling/inflammation; no significant hotness of area or fluctuance of area CV: brisk extremity  capillary refill of RUE; 2+ radial pulse of RUE. Skin: warm and dry; no visible rashes Neurologic: normal distal sensation of her R thumb Psychological: alert and cooperative; normal mood and affect  Allergies  Allergen Reactions  . Codeine Nausea And Vomiting  . Hydrocodone Itching  . Percocet [Oxycodone-Acetaminophen] Rash    Past Medical History:  Diagnosis Date  . Bronchitis    history  . Chronic kidney disease    kidney stone  . PONV (postoperative nausea and vomiting)   . Seasonal allergies    Social History    Socioeconomic History  . Marital status: Single    Spouse name: Not on file  . Number of children: Not on file  . Years of education: Not on file  . Highest education level: Not on file  Occupational History  . Not on file  Social Needs  . Financial resource strain: Not on file  . Food insecurity:    Worry: Not on file    Inability: Not on file  . Transportation needs:    Medical: Not on file    Non-medical: Not on file  Tobacco Use  . Smoking status: Former Research scientist (life sciences)  . Smokeless tobacco: Never Used  Substance and Sexual Activity  . Alcohol use: Yes    Comment: rare  . Drug use: No  . Sexual activity: Yes    Partners: Male    Comment: 1ST INTERCOURSE- 78, PARTNERS- 75, [place]- 3   Lifestyle  . Physical activity:    Days per week: Not on file    Minutes per session: Not on file  . Stress: Not on file  Relationships  . Social connections:    Talks on phone: Not on file    Gets together: Not on file    Attends religious service: Not on file    Active member of club or organization: Not on file    Attends meetings of clubs or organizations: Not on file    Relationship status: Not on file  Other Topics Concern  . Not on file  Social History Narrative  . Not on file   Family History  Problem Relation Age of Onset  . Cancer Maternal Grandfather        LUNG   . Cancer Paternal Grandfather        PROSTATE  . Breast cancer Paternal Grandmother   . Diabetes Paternal Grandmother   . Transient ischemic attack Maternal Grandmother    Past Surgical History:  Procedure Laterality Date  . AUGMENTATION MAMMAPLASTY Bilateral   . BREAST ENHANCEMENT SURGERY  1999   bilat  . CARPAL TUNNEL RELEASE Bilateral 2017   TRIGGER THUMB RELEASE  . CYSTOSCOPY W/ STONE MANIPULATION  1994  . dental implants    . HIP SURGERY  APRIL 2014   HEMATOMA  . HYSTEROSCOPY W/D&C N/A 01/11/2014   Procedure: DILATATION AND CURETTAGE /HYSTEROSCOPY WITH REMOVAL OF IUD FRAGMENT;  Surgeon:  Terrance Mass, MD;  Location: [facility];  Service: Gynecology;  Laterality: N/A;  . KNEE ARTHROSCOPY  1991   right  . KNEE ARTHROSCOPY  1992   left  . MASS EXCISION Right 08/17/2012   Procedure: EXCISION OF RIGHT LEG FAT NECROSIS;  Surgeon: Theodoro Kos, DO;  Location: [facility];  Service: Plastics;  Laterality: Right;  . MOUTH SURGERY  10/2016   IMPLANT   . TONSILLECTOMY  1984      Vanessa Kick, MD 05/17/18 [phone]

## 2018-06-21 ENCOUNTER — Encounter: Payer: Self-pay | Admitting: Family

## 2018-06-21 ENCOUNTER — Ambulatory Visit: Payer: 59 | Admitting: Family

## 2018-06-21 VITALS — BP 118/76 | HR 80 | Temp 98.5°F | Ht 66.25 in | Wt 196.0 lb

## 2018-06-21 DIAGNOSIS — C4491 Basal cell carcinoma of skin, unspecified: Secondary | ICD-10-CM

## 2018-06-21 DIAGNOSIS — L918 Other hypertrophic disorders of the skin: Secondary | ICD-10-CM

## 2018-06-21 DIAGNOSIS — N951 Menopausal and female climacteric states: Secondary | ICD-10-CM | POA: Diagnosis not present

## 2018-06-21 MED ORDER — VENLAFAXINE HCL ER 37.5 MG PO CP24: 37.5000 mg | ORAL_CAPSULE | Freq: Every day | ORAL | 1 refills | Status: DC

## 2018-06-21 NOTE — Progress Notes (Signed)
Deborah Bright is a 48 y.o. female with the following history as recorded in EpicCare:  Patient Active Problem List   Diagnosis Date Noted  . Weight gain 01/04/2014  . Other mechanical complication of intrauterine contraceptive device 01/04/2014  . Lipoma 08/17/2012    Current Outpatient Medications  Medication Sig Dispense Refill  . progesterone (PROMETRIUM) 100 MG capsule Take 1 capsule (100 mg total) by mouth at bedtime. 90 capsule 4  . venlafaxine XR (EFFEXOR XR) 37.5 MG 24 hr capsule Take 1 capsule (37.5 mg total) by mouth daily with breakfast. 30 capsule 1   No current facility-administered medications for this visit.     Allergies: Codeine; Hydrocodone; and Percocet [oxycodone-acetaminophen]  Past Medical History:  Diagnosis Date  . Bronchitis    history  . Chronic kidney disease    kidney stone  . PONV (postoperative nausea and vomiting)   . Seasonal allergies     Past Surgical History:  Procedure Laterality Date  . AUGMENTATION MAMMAPLASTY Bilateral   . BREAST ENHANCEMENT SURGERY  1999   bilat  . CARPAL TUNNEL RELEASE Bilateral 2017   TRIGGER THUMB RELEASE  . CYSTOSCOPY W/ STONE MANIPULATION  1994  . dental implants    . HIP SURGERY  APRIL 2014   HEMATOMA  . HYSTEROSCOPY W/D&C N/A 01/11/2014   Procedure: DILATATION AND CURETTAGE /HYSTEROSCOPY WITH REMOVAL OF IUD FRAGMENT;  Surgeon: Terrance Mass, MD;  Location: [facility];  Service: Gynecology;  Laterality: N/A;  . KNEE ARTHROSCOPY  1991   right  . KNEE ARTHROSCOPY  1992   left  . MASS EXCISION Right 08/17/2012   Procedure: EXCISION OF RIGHT LEG FAT NECROSIS;  Surgeon: Theodoro Kos, DO;  Location: [facility];  Service: Plastics;  Laterality: Right;  . MOUTH SURGERY  10/2016   IMPLANT   . TONSILLECTOMY  1984    Family History  Problem Relation Age of Onset  . Cancer Maternal Grandfather        LUNG   . Cancer Paternal Grandfather        PROSTATE  . Breast cancer Paternal Grandmother   .  Diabetes Paternal Grandmother   . Transient ischemic attack Maternal Grandmother     Social History   Tobacco Use  . Smoking status: Former Research scientist (life sciences)  . Smokeless tobacco: Never Used  Substance Use Topics  . Alcohol use: Yes    Comment: rare    Subjective:  Patient presents today as a new patient; realized that she needs a PCP; in baseline state of health today; majority of healthcare needs are managed by GYN; works as a Technical brewer with [employer]; fiance also works in Event organiser; notes that at night often has a hard time "turning her brain off." Does have increased problems with hot flashes; Physical exam is managed by GYN; up to date on dental exams; also needs dermatology referral;      Objective:  Vitals:   06/21/18 0917  BP: 118/76  Pulse: 80  Temp: 98.5 F (36.9 C)  TempSrc: Oral  SpO2: 97%  Weight: 196 lb (88.9 kg)  Height: 5' 6.25" (1.683 m)    General: Well developed, well nourished, in no acute distress  Skin : Warm and dry.  Head: Normocephalic and atraumatic  Eyes: Sclera and conjunctiva clear; pupils round and reactive to light; extraocular movements intact  Ears: External normal; canals clear; tympanic membranes normal  Oropharynx: Pink, supple. No suspicious lesions  Neck: Supple without thyromegaly, adenopathy  Lungs:  Respirations unlabored; clear to auscultation bilaterally without wheeze, rales, rhonchi  CVS exam: normal rate and regular rhythm.  Neurologic: Alert and oriented; speech intact; face symmetrical; moves all extremities well; CNII-XII intact without focal deficit   Assessment:  1. Peri-menopausal   2. Basal cell carcinoma (BCC), unspecified site   3. Skin tags, multiple acquired     Plan:  1. Trial of Effexor XR 37.5 mg qd- can increase to 2 per day; risks, benefits discussed; she will follow-up with MyChart message to discuss response; 2. & 3. Refer to dermatology;   No follow-ups on file.  Orders Placed This  Encounter  Procedures  . Ambulatory referral to Dermatology    Referral Priority:   Routine    Referral Type:   Consultation    Referral Reason:   Specialty Services Required    Requested Specialty:   Dermatology    Number of Visits Requested:   1    Requested Prescriptions   Signed Prescriptions Disp Refills  . venlafaxine XR (EFFEXOR XR) 37.5 MG 24 hr capsule 30 capsule 1    Sig: Take 1 capsule (37.5 mg total) by mouth daily with breakfast.

## 2018-07-20 ENCOUNTER — Telehealth: Payer: Self-pay | Admitting: Family

## 2018-07-20 MED ORDER — PROGESTERONE MICRONIZED 100 MG PO CAPS: 100.0000 mg | ORAL_CAPSULE | Freq: Every day | ORAL | 4 refills | Status: DC

## 2018-07-20 NOTE — Telephone Encounter (Signed)
Please see if she is comfortable with this dosage. We had discussed possibility of 75 mg.

## 2018-07-20 NOTE — Telephone Encounter (Signed)
Copied from [place] [phone]. Topic: Quick Communication - Rx Refill/Question >> Jul 20, 2018 10:30 AM Lalani Breath wrote: Medication: venlafaxine XR (EFFEXOR XR) 37.5 MG 24 hr capsule  Patient called to request a refill for the above medication  Preferred Pharmacy (with phone number or street name): [id number] - [place], [place]. AT [address] [place] [phone] (Phone) [phone] (Fax)

## 2018-07-21 ENCOUNTER — Other Ambulatory Visit: Payer: Self-pay | Admitting: Family

## 2018-07-21 MED ORDER — VENLAFAXINE HCL ER 37.5 MG PO CP24: 37.5000 mg | ORAL_CAPSULE | Freq: Every day | ORAL | 5 refills | Status: DC

## 2018-12-27 ENCOUNTER — Other Ambulatory Visit: Payer: Self-pay | Admitting: Obstetrics & Gynecology

## 2018-12-27 DIAGNOSIS — Z1231 Encounter for screening mammogram for malignant neoplasm of breast: Secondary | ICD-10-CM

## 2019-02-09 ENCOUNTER — Ambulatory Visit
Admission: RE | Admit: 2019-02-09 | Discharge: 2019-02-09 | Disposition: A | Discharge status: Home / Self Care | Payer: 59 | Source: Ambulatory Visit | Attending: Obstetrics & Gynecology | Admitting: Obstetrics & Gynecology

## 2019-02-09 ENCOUNTER — Other Ambulatory Visit: Payer: Self-pay

## 2019-02-09 DIAGNOSIS — Z1231 Encounter for screening mammogram for malignant neoplasm of breast: Secondary | ICD-10-CM

## 2019-03-14 ENCOUNTER — Encounter: Payer: 59 | Admitting: Obstetrics & Gynecology

## 2019-03-15 ENCOUNTER — Other Ambulatory Visit: Payer: Self-pay

## 2019-03-15 ENCOUNTER — Encounter: Payer: Self-pay | Admitting: Obstetrics & Gynecology

## 2019-03-15 ENCOUNTER — Ambulatory Visit (INDEPENDENT_AMBULATORY_CARE_PROVIDER_SITE_OTHER): Payer: 59 | Admitting: Obstetrics & Gynecology

## 2019-03-15 VITALS — BP 136/84 | Ht 66.0 in | Wt 206.0 lb

## 2019-03-15 DIAGNOSIS — Z9189 Other specified personal risk factors, not elsewhere classified: Secondary | ICD-10-CM

## 2019-03-15 DIAGNOSIS — N951 Menopausal and female climacteric states: Secondary | ICD-10-CM

## 2019-03-15 DIAGNOSIS — Z01419 Encounter for gynecological examination (general) (routine) without abnormal findings: Secondary | ICD-10-CM | POA: Diagnosis not present

## 2019-03-15 DIAGNOSIS — E6609 Other obesity due to excess calories: Secondary | ICD-10-CM

## 2019-03-15 DIAGNOSIS — Z6833 Body mass index (BMI) 33.0-33.9, adult: Secondary | ICD-10-CM

## 2019-03-15 NOTE — Addendum Note (Signed)
Addended by: Thurnell Garbe A on: 03/15/2019 04:11 PM   Modules accepted: Orders

## 2019-03-15 NOTE — Progress Notes (Signed)
Deborah Bright [birth year] [id number]   History:    48 y.o. G0 Engaged.  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  RP:  Established patient presenting for annual gyn exam   HPI: Menses regular normal every month on Prometrium 100 mg HS.  No BTB.  Would like to stop Prometrium now that her work is less stressful.  Mild hot flushes.  No pelvic pain.  No pain with IC.  Urine/BMs normal.  Breasts normal.  BMI 33.25.  Walking.  Health labs with Fam MD.  Past medical history,surgical history, family history and social history were all reviewed and documented in the EPIC chart.  Gynecologic History Patient's last menstrual period was 02/16/2019. Contraception: vasectomy Last Pap: 01/2017. Results were: Negative/HPV HR negative Last mammogram: 02/2019. Results were: Negative Bone Density: Never Colonoscopy: Never  Obstetric History OB History  Gravida Para Term Preterm AB Living  0            SAB TAB Ectopic Multiple Live Births                ROS: A ROS was performed and pertinent positives and negatives are included in the history.  GENERAL: No fevers or chills. HEENT: No change in vision, no earache, sore throat or sinus congestion. NECK: No pain or stiffness. CARDIOVASCULAR: No chest pain or pressure. No palpitations. PULMONARY: No shortness of breath, cough or wheeze. GASTROINTESTINAL: No abdominal pain, nausea, vomiting or diarrhea, melena or bright red blood per rectum. GENITOURINARY: No urinary frequency, urgency, hesitancy or dysuria. MUSCULOSKELETAL: No joint or muscle pain, no back pain, no recent trauma. DERMATOLOGIC: No rash, no itching, no lesions. ENDOCRINE: No polyuria, polydipsia, no heat or cold intolerance. No recent change in weight. HEMATOLOGICAL: No anemia or easy bruising or bleeding. NEUROLOGIC: No headache, seizures, numbness, tingling or weakness. PSYCHIATRIC: No depression, no loss of interest in normal activity or change in sleep pattern.     Exam:   BP 136/84   Ht 5'  6" (1.676 m)   Wt 206 lb (93.4 kg)   LMP 02/16/2019   BMI 33.25 kg/m   Body mass index is 33.25 kg/m.  General appearance : Well developed well nourished female. No acute distress HEENT: Eyes: no retinal hemorrhage or exudates,  Neck supple, trachea midline, no carotid bruits, no thyroidmegaly Lungs: Clear to auscultation, no rhonchi or wheezes, or rib retractions  Heart: Regular rate and rhythm, no murmurs or gallops Breast:Examined in sitting and supine position were symmetrical in appearance status post bilateral implants, no palpable masses or tenderness,  no skin retraction, no nipple inversion, no nipple discharge, no skin discoloration, no axillary or supraclavicular lymphadenopathy Abdomen: no palpable masses or tenderness, no rebound or guarding Extremities: no edema or skin discoloration or tenderness  Pelvic: Vulva: Normal, except from a small skin tag on Rt labia minora.             Vagina: No gross lesions or discharge  Cervix: No gross lesions or discharge.  Pap reflex done.  Uterus  AV, normal size, shape and consistency, non-tender and mobile  Adnexa  Without masses or tenderness  Anus: Normal   Assessment/Plan:  48 y.o. female for annual exam   1. Encounter for routine gynecological examination with Papanicolaou smear of cervix Normal gynecologic exam.  Pap reflex done.  Breast exam status post bilateral implants normal.  Screening mammogram October 2020 was negative.  Health labs with nurse practitioner.  2. Relies on partner vasectomy for contraception  3.  Perimenopausal Menstrual periods every month with mild hot flushes.  Decision to stop Prometrium at bedtime to see if it helps with her weight loss.  4. Class 1 obesity due to excess calories without serious comorbidity with body mass index (BMI) of 33.0 to 33.9 in adult Low calorie/carb diet such as [employer] suggested.  Aerobic activities 5 times a week and weightlifting every 2 days.  Princess Bruins MD, 8:21 AM 03/15/2019

## 2019-03-15 NOTE — Patient Instructions (Signed)
1. Encounter for routine gynecological examination with Papanicolaou smear of cervix Normal gynecologic exam.  Pap reflex done.  Breast exam status post bilateral implants normal.  Screening mammogram October 2020 was negative.  Health labs with nurse practitioner.  2. Relies on partner vasectomy for contraception  3. Perimenopausal Menstrual periods every month with mild hot flushes.  Decision to stop Prometrium at bedtime to see if it helps with her weight loss.  4. Class 1 obesity due to excess calories without serious comorbidity with body mass index (BMI) of 33.0 to 33.9 in adult Low calorie/carb diet such as [employer] suggested.  Aerobic activities 5 times a week and weightlifting every 2 days.  Deborah Bright, it was a pleasure seeing you today!  I will inform you of your results as soon as they are available.

## 2019-03-16 ENCOUNTER — Encounter: Payer: Self-pay | Admitting: *Deleted

## 2019-03-16 LAB — PAP IG W/ RFLX HPV ASCU

## 2019-12-31 ENCOUNTER — Other Ambulatory Visit: Payer: Self-pay | Admitting: Obstetrics & Gynecology

## 2019-12-31 DIAGNOSIS — Z1231 Encounter for screening mammogram for malignant neoplasm of breast: Secondary | ICD-10-CM

## 2020-01-25 ENCOUNTER — Encounter: Payer: 59 | Admitting: Family

## 2020-02-22 ENCOUNTER — Other Ambulatory Visit: Payer: Self-pay

## 2020-02-22 ENCOUNTER — Ambulatory Visit
Admission: RE | Admit: 2020-02-22 | Discharge: 2020-02-22 | Disposition: A | Discharge status: Home / Self Care | Payer: 59 | Source: Ambulatory Visit | Attending: Obstetrics & Gynecology | Admitting: Obstetrics & Gynecology

## 2020-02-22 DIAGNOSIS — Z1231 Encounter for screening mammogram for malignant neoplasm of breast: Secondary | ICD-10-CM

## 2020-03-17 ENCOUNTER — Encounter: Payer: Self-pay | Admitting: Obstetrics & Gynecology

## 2020-03-17 ENCOUNTER — Other Ambulatory Visit: Payer: Self-pay

## 2020-03-17 ENCOUNTER — Ambulatory Visit (INDEPENDENT_AMBULATORY_CARE_PROVIDER_SITE_OTHER): Payer: 59 | Admitting: Obstetrics & Gynecology

## 2020-03-17 ENCOUNTER — Encounter: Payer: 59 | Admitting: Obstetrics & Gynecology

## 2020-03-17 VITALS — BP 126/82 | Ht 66.0 in | Wt 201.0 lb

## 2020-03-17 DIAGNOSIS — Z01419 Encounter for gynecological examination (general) (routine) without abnormal findings: Secondary | ICD-10-CM

## 2020-03-17 DIAGNOSIS — E6609 Other obesity due to excess calories: Secondary | ICD-10-CM

## 2020-03-17 DIAGNOSIS — Z9189 Other specified personal risk factors, not elsewhere classified: Secondary | ICD-10-CM | POA: Diagnosis not present

## 2020-03-17 DIAGNOSIS — Z6832 Body mass index (BMI) 32.0-32.9, adult: Secondary | ICD-10-CM

## 2020-03-17 NOTE — Progress Notes (Signed)
Deborah Bright 08/14/1970 [id number]   History:    50 y.o.  G0 Engaged.  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  RP:  Established patient presenting for annual gyn exam   HPI: Menses regular normal every month.  Very rare BTB, twice in the last year.  Mild hot flushes.  No pelvic pain.  No pain with IC. Urine/BMs normal.  Breasts normal.  BMI 32.44.  Walking.  Health labs with Fam MD.  Will refer for first Colonoscopy.   Past medical history,surgical history, family history and social history were all reviewed and documented in the EPIC chart.  Gynecologic History Patient's last menstrual period was 03/08/2020.  Obstetric History OB History  Gravida Para Term Preterm AB Living  0            SAB TAB Ectopic Multiple Live Births                ROS: A ROS was performed and pertinent positives and negatives are included in the history.  GENERAL: No fevers or chills. HEENT: No change in vision, no earache, sore throat or sinus congestion. NECK: No pain or stiffness. CARDIOVASCULAR: No chest pain or pressure. No palpitations. PULMONARY: No shortness of breath, cough or wheeze. GASTROINTESTINAL: No abdominal pain, nausea, vomiting or diarrhea, melena or bright red blood per rectum. GENITOURINARY: No urinary frequency, urgency, hesitancy or dysuria. MUSCULOSKELETAL: No joint or muscle pain, no back pain, no recent trauma. DERMATOLOGIC: No rash, no itching, no lesions. ENDOCRINE: No polyuria, polydipsia, no heat or cold intolerance. No recent change in weight. HEMATOLOGICAL: No anemia or easy bruising or bleeding. NEUROLOGIC: No headache, seizures, numbness, tingling or weakness. PSYCHIATRIC: No depression, no loss of interest in normal activity or change in sleep pattern.     Exam:   BP 126/82   Ht 5\' 6"  (1.676 m)   Wt 201 lb (91.2 kg)   LMP 03/08/2020   BMI 32.44 kg/m   Body mass index is 32.44 kg/m.  General appearance : Well developed well nourished female. No acute  distress HEENT: Eyes: no retinal hemorrhage or exudates,  Neck supple, trachea midline, no carotid bruits, no thyroidmegaly Lungs: Clear to auscultation, no rhonchi or wheezes, or rib retractions  Heart: Regular rate and rhythm, no murmurs or gallops Breast:Examined in sitting and supine position were symmetrical in appearance, no palpable masses or tenderness,  no skin retraction, no nipple inversion, no nipple discharge, no skin discoloration, no axillary or supraclavicular lymphadenopathy Abdomen: no palpable masses or tenderness, no rebound or guarding Extremities: no edema or skin discoloration or tenderness  Pelvic: Vulva: Normal             Vagina: No gross lesions or discharge  Cervix: No gross lesions or discharge.   Uterus  AV, normal size, shape and consistency, non-tender and mobile  Adnexa  Without masses or tenderness  Anus: Normal   Assessment/Plan:  49 y.o. female for annual exam   1. Well female exam with routine gynecological exam Normal gynecologic exam.  Menstrual periods every month with normal flow.  Very rare episodes of breakthrough bleeding, 2 episodes in the last year.  Bleeding precautions discussed with patient.  Will call to schedule a pelvic ultrasound if worsening irregular menses.  Pap test November 2020 was negative, no indication to repeat this year.  Breast exam normal.  Screening mammogram October 2021 was negative.  Will refer patient to gastroenterology for first colonoscopy.  Health labs with family physician.  2. Relies  on partner vasectomy for contraception  3. Class 1 obesity due to excess calories without serious comorbidity with body mass index (BMI) of 32.0 to 32.9 in adult Lower calorie/carb diet through a decrease in portions recommended.  Continue with walking.  Recommend light weightlifting every 2 days.  Other orders - cholecalciferol (VITAMIN D3) 25 MCG (1000 UNIT) tablet; Take 2,000 Units by mouth daily.  Princess Bruins MD, 2:20 PM  03/17/2020

## 2020-03-18 ENCOUNTER — Encounter: Payer: Self-pay | Admitting: *Deleted

## 2020-05-20 ENCOUNTER — Encounter: Payer: Self-pay | Admitting: Gastroenterology

## 2020-06-16 ENCOUNTER — Other Ambulatory Visit: Payer: Self-pay

## 2020-06-16 ENCOUNTER — Other Ambulatory Visit (INDEPENDENT_AMBULATORY_CARE_PROVIDER_SITE_OTHER): Payer: 59

## 2020-06-16 ENCOUNTER — Ambulatory Visit (INDEPENDENT_AMBULATORY_CARE_PROVIDER_SITE_OTHER): Payer: 59 | Admitting: Family

## 2020-06-16 VITALS — BP 118/72 | HR 75 | Temp 98.1°F | Ht 66.0 in | Wt 200.0 lb

## 2020-06-16 DIAGNOSIS — R7301 Impaired fasting glucose: Secondary | ICD-10-CM | POA: Diagnosis not present

## 2020-06-16 DIAGNOSIS — R5383 Other fatigue: Secondary | ICD-10-CM | POA: Diagnosis not present

## 2020-06-16 DIAGNOSIS — Z0000 Encounter for general adult medical examination without abnormal findings: Secondary | ICD-10-CM

## 2020-06-16 DIAGNOSIS — Z13220 Encounter for screening for lipoid disorders: Secondary | ICD-10-CM | POA: Diagnosis not present

## 2020-06-16 DIAGNOSIS — E559 Vitamin D deficiency, unspecified: Secondary | ICD-10-CM | POA: Diagnosis not present

## 2020-06-16 LAB — CBC WITH DIFFERENTIAL/PLATELET
Basophils Absolute: 0 10*3/uL (ref 0.0–0.1)
Basophils Relative: 0.7 % (ref 0.0–3.0)
Eosinophils Absolute: 0.1 10*3/uL (ref 0.0–0.7)
Eosinophils Relative: 2 % (ref 0.0–5.0)
HCT: 43.4 % (ref 36.0–46.0)
Hemoglobin: 14.8 g/dL (ref 12.0–15.0)
Lymphocytes Relative: 40.2 % (ref 12.0–46.0)
Lymphs Abs: 2.7 10*3/uL (ref 0.7–4.0)
MCHC: 34.1 g/dL (ref 30.0–36.0)
MCV: 93.5 fl (ref 78.0–100.0)
Monocytes Absolute: 0.5 10*3/uL (ref 0.1–1.0)
Monocytes Relative: 7.9 % (ref 3.0–12.0)
Neutro Abs: 3.3 10*3/uL (ref 1.4–7.7)
Neutrophils Relative %: 49.2 % (ref 43.0–77.0)
Platelets: 192 10*3/uL (ref 150.0–400.0)
RBC: 4.64 Mil/uL (ref 3.87–5.11)
RDW: 12.5 % (ref 11.5–15.5)
WBC: 6.6 10*3/uL (ref 4.0–10.5)

## 2020-06-16 LAB — COMPREHENSIVE METABOLIC PANEL
ALT: 22 U/L (ref 0–35)
AST: 19 U/L (ref 0–37)
Albumin: 4.3 g/dL (ref 3.5–5.2)
Alkaline Phosphatase: 47 U/L (ref 39–117)
BUN: 18 mg/dL (ref 6–23)
CO2: 28 mEq/L (ref 19–32)
Calcium: 8.9 mg/dL (ref 8.4–10.5)
Chloride: 104 mEq/L (ref 96–112)
Creatinine, Ser: 0.89 mg/dL (ref 0.40–1.20)
GFR: 75.66 mL/min (ref >60.00)
Glucose, Bld: 119 mg/dL — ABNORMAL HIGH (ref 70–99)
Potassium: 4.2 mEq/L (ref 3.5–5.1)
Sodium: 138 mEq/L (ref 135–145)
Total Bilirubin: 0.5 mg/dL (ref 0.2–1.2)
Total Protein: 6.7 g/dL (ref 6.0–8.3)

## 2020-06-16 LAB — LIPID PANEL
Cholesterol: 235 mg/dL — ABNORMAL HIGH (ref 0–200)
HDL: 50.9 mg/dL (ref >39.00)
LDL Cholesterol: 155 mg/dL — ABNORMAL HIGH (ref 0–99)
NonHDL: 184.02
Total CHOL/HDL Ratio: 5
Triglycerides: 143 mg/dL (ref 0.0–149.0)
VLDL: 28.6 mg/dL (ref 0.0–40.0)

## 2020-06-16 LAB — TSH: TSH: 2.07 u[IU]/mL (ref 0.35–4.50)

## 2020-06-16 LAB — VITAMIN B12: Vitamin B-12: 418 pg/mL (ref 211–911)

## 2020-06-16 LAB — VITAMIN D 25 HYDROXY (VIT D DEFICIENCY, FRACTURES): VITD: 48 ng/mL (ref 30.00–100.00)

## 2020-06-16 LAB — HEMOGLOBIN A1C: Hgb A1c MFr Bld: 6 % (ref 4.6–6.5)

## 2020-06-16 NOTE — Progress Notes (Signed)
Deborah Bright is a 50 y.o. female with the following history as recorded in EpicCare:  Patient Active Problem List   Diagnosis Date Noted  . Weight gain 01/04/2014  . Other mechanical complication of intrauterine contraceptive device 01/04/2014  . Lipoma 08/17/2012    Current Outpatient Medications  Medication Sig Dispense Refill  . cholecalciferol (VITAMIN D3) 25 MCG (1000 UNIT) tablet Take 2,000 Units by mouth daily.     No current facility-administered medications for this visit.    Allergies: Codeine, Hydrocodone, and Percocet [oxycodone-acetaminophen]  Past Medical History:  Diagnosis Date  . Bronchitis    history  . Chronic kidney disease    kidney stone  . PONV (postoperative nausea and vomiting)   . Seasonal allergies     Past Surgical History:  Procedure Laterality Date  . AUGMENTATION MAMMAPLASTY Bilateral 2000   saline   . BREAST ENHANCEMENT SURGERY  1999   bilat  . CARPAL TUNNEL RELEASE Bilateral 2017   TRIGGER THUMB RELEASE  . CYSTOSCOPY W/ STONE MANIPULATION  1994  . dental implants    . HIP SURGERY  APRIL 2014   HEMATOMA  . HYSTEROSCOPY WITH D & C N/A 01/11/2014   Procedure: DILATATION AND CURETTAGE /HYSTEROSCOPY WITH REMOVAL OF IUD FRAGMENT;  Surgeon: Terrance Mass, MD;  Location: [facility];  Service: Gynecology;  Laterality: N/A;  . KNEE ARTHROSCOPY  1991   right  . KNEE ARTHROSCOPY  1992   left  . MASS EXCISION Right 08/17/2012   Procedure: EXCISION OF RIGHT LEG FAT NECROSIS;  Surgeon: Theodoro Kos, DO;  Location: [facility];  Service: Plastics;  Laterality: Right;  . MOUTH SURGERY  10/2016   IMPLANT   . TONSILLECTOMY  1984    Family History  Problem Relation Age of Onset  . Cancer Maternal Grandfather        LUNG   . Cancer Paternal Grandfather        PROSTATE  . Breast cancer Paternal Grandmother   . Diabetes Paternal Grandmother   . Transient ischemic attack Maternal Grandmother     Social History   Tobacco Use  . Smoking  status: Former Research scientist (life sciences)  . Smokeless tobacco: Never Used  Substance Use Topics  . Alcohol use: Yes    Comment: 4- drinks a week    Subjective:  Presents for yearly CPE; sees GYN regularly; scheduled for colonoscopy next month; Sees dentist regularly; overdue to see eye doctor; Working on weight loss goals;  Review of Systems  Constitutional: Positive for malaise/fatigue.  HENT: Negative.   Eyes: Negative.   Respiratory: Negative.   Cardiovascular: Negative.   Gastrointestinal: Negative.   Genitourinary: Negative.   Musculoskeletal: Negative.   Skin: Negative.   Neurological: Negative.   Endo/Heme/Allergies: Negative.   Psychiatric/Behavioral: Negative.      Objective:  Vitals:   06/16/20 0847  BP: 118/72  Pulse: 75  Temp: 98.1 F (36.7 C)  TempSrc: Oral  SpO2: 96%  Weight: 200 lb (90.7 kg)  Height: '5\' 6"'  (1.676 m)    General: Well developed, well nourished, in no acute distress  Skin : Warm and dry.  Head: Normocephalic and atraumatic  Eyes: Sclera and conjunctiva clear; pupils round and reactive to light; extraocular movements intact  Ears: External normal; canals clear; tympanic membranes normal  Oropharynx: Pink, supple. No suspicious lesions  Neck: Supple without thyromegaly, adenopathy  Lungs: Respirations unlabored; clear to auscultation bilaterally without wheeze, rales, rhonchi  CVS exam: normal rate and regular rhythm.  Abdomen: Soft; nontender; nondistended; normoactive bowel sounds; no masses or hepatosplenomegaly  Musculoskeletal: No deformities; no active joint inflammation  Extremities: No edema, cyanosis, clubbing  Vessels: Symmetric bilaterally  Neurologic: Alert and oriented; speech intact; face symmetrical; moves all extremities well; CNII-XII intact without focal deficit   Assessment:  1. PE (physical exam), annual   2. Lipid screening   3. Vitamin D deficiency   4. Other fatigue     Plan:  Age appropriate preventive healthcare needs  addressed; encouraged regular eye doctor and dental exams; encouraged regular exercise; will update labs and refills as needed today; follow-up to be determined;  This visit occurred during the SARS-CoV-2 public health emergency.  Safety protocols were in place, including screening questions prior to the visit, additional usage of staff PPE, and extensive cleaning of exam room while observing appropriate contact time as indicated for disinfecting solutions.      No follow-ups on file.  Orders Placed This Encounter  Procedures  . CBC with Differential/Platelet    Standing Status:   Future    Standing Expiration Date:   06/16/2021  . Comp Met (CMET)    Standing Status:   Future    Standing Expiration Date:   06/16/2021  . Lipid panel    Standing Status:   Future    Standing Expiration Date:   06/16/2021  . TSH    Standing Status:   Future    Standing Expiration Date:   06/16/2021  . Vitamin D (25 hydroxy)    Standing Status:   Future    Standing Expiration Date:   06/16/2021  . Vitamin B12    Standing Status:   Future    Standing Expiration Date:   06/16/2021    Requested Prescriptions    No prescriptions requested or ordered in this encounter

## 2020-06-16 NOTE — Addendum Note (Signed)
Addended by: Boris Lown B on: 06/16/2020 09:18 AM   Modules accepted: Orders

## 2020-06-16 NOTE — Addendum Note (Signed)
Addended by: Elza Rafter D on: 06/16/2020 01:20 PM   Modules accepted: Orders

## 2020-07-04 ENCOUNTER — Ambulatory Visit (AMBULATORY_SURGERY_CENTER): Payer: 59 | Admitting: *Deleted

## 2020-07-04 ENCOUNTER — Other Ambulatory Visit: Payer: Self-pay

## 2020-07-04 VITALS — Ht 68.0 in | Wt 196.0 lb

## 2020-07-04 DIAGNOSIS — Z1211 Encounter for screening for malignant neoplasm of colon: Secondary | ICD-10-CM

## 2020-07-04 MED ORDER — PLENVU 140 G PO SOLR: 1.0000 | Freq: Once | ORAL | 0 refills | Status: AC

## 2020-07-04 NOTE — Progress Notes (Addendum)
No egg or soy allergy known to patient  No issues with past sedation with any surgeries or procedures No intubation problems in the past  No FH of Malignant Hyperthermia No diet pills per patient No home 02 use per patient  No blood thinners per patient  Pt denies issues with constipation  No A fib or A flutter  EMMI video to pt or via [place] 19 guidelines implemented in PV today with Pt and RN  Pt is fully vaccinated  for [employer] previsit completed. Instructions mailed through [place]. Mailed consent and coupon,.   Pharmacy and  NO PA's for preps discussed with pt In PV today  Discussed with pt there will be an out-of-pocket cost for prep and that varies from $0 to 70 dollars   Due to the COVID-19 pandemic we are asking patients to follow certain guidelines.  Pt aware of COVID protocols and [facility] guidelines

## 2020-07-07 ENCOUNTER — Encounter: Payer: Self-pay | Admitting: Gastroenterology

## 2020-07-18 ENCOUNTER — Encounter: Payer: Self-pay | Admitting: Gastroenterology

## 2020-07-18 ENCOUNTER — Other Ambulatory Visit: Payer: Self-pay

## 2020-07-18 ENCOUNTER — Ambulatory Visit (AMBULATORY_SURGERY_CENTER): Payer: 59 | Admitting: Gastroenterology

## 2020-07-18 VITALS — BP 124/66 | HR 56 | Temp 97.3°F | Resp 18 | Ht 66.0 in | Wt 196.0 lb

## 2020-07-18 DIAGNOSIS — D125 Benign neoplasm of sigmoid colon: Secondary | ICD-10-CM

## 2020-07-18 DIAGNOSIS — Z1211 Encounter for screening for malignant neoplasm of colon: Secondary | ICD-10-CM | POA: Diagnosis not present

## 2020-07-18 DIAGNOSIS — D123 Benign neoplasm of transverse colon: Secondary | ICD-10-CM | POA: Diagnosis not present

## 2020-07-18 MED ORDER — SODIUM CHLORIDE 0.9 % IV SOLN: 500.0000 mL | Freq: Once | INTRAVENOUS | Status: DC

## 2020-07-18 NOTE — Patient Instructions (Signed)
Start high fiber diet, use fibercon 1-2 tablets by mouth daily. Await pathology results, repeat colonoscopy in 3/09/06/08 years based on pathology results.  YOU HAD AN ENDOSCOPIC PROCEDURE TODAY AT [facility]:   Refer to the procedure report that was given to you for any specific questions about what was found during the examination.  If the procedure report does not answer your questions, please call your gastroenterologist to clarify.  If you requested that your care partner not be given the details of your procedure findings, then the procedure report has been included in a sealed envelope for you to review at your convenience later.  YOU SHOULD EXPECT: Some feelings of bloating in the abdomen. Passage of more gas than usual.  Walking can help get rid of the air that was put into your GI tract during the procedure and reduce the bloating. If you had a lower endoscopy (such as a colonoscopy or flexible sigmoidoscopy) you may notice spotting of blood in your stool or on the toilet paper. If you underwent a bowel prep for your procedure, you may not have a normal bowel movement for a few days.  Please Note:  You might notice some irritation and congestion in your nose or some drainage.  This is from the oxygen used during your procedure.  There is no need for concern and it should clear up in a day or so.  SYMPTOMS TO REPORT IMMEDIATELY:   Following lower endoscopy (colonoscopy or flexible sigmoidoscopy):  Excessive amounts of blood in the stool  Significant tenderness or worsening of abdominal pains  Swelling of the abdomen that is new, acute  Fever of 100F or higher   For urgent or emergent issues, a gastroenterologist can be reached at any hour by calling [phone]. Do not use MyChart messaging for urgent concerns.    DIET:  We do recommend a small meal at first, but then you may proceed to your regular diet.  Drink plenty of fluids but you should avoid alcoholic  beverages for 24 hours.  ACTIVITY:  You should plan to take it easy for the rest of today and you should NOT DRIVE or use heavy machinery until tomorrow (because of the sedation medicines used during the test).    FOLLOW UP: Our staff will call the number listed on your records 48-72 hours following your procedure to check on you and address any questions or concerns that you may have regarding the information given to you following your procedure. If we do not reach you, we will leave a message.  We will attempt to reach you two times.  During this call, we will ask if you have developed any symptoms of COVID 19. If you develop any symptoms (ie: fever, flu-like symptoms, shortness of breath, cough etc.) before then, please call [phone].  If you test positive for Covid 19 in the 2 weeks post procedure, please call and report this information to [place].    If any biopsies were taken you will be contacted by phone or by letter within the next 1-3 weeks.  Please call us at [phone] if you have not heard about the biopsies in 3 weeks.    SIGNATURES/CONFIDENTIALITY: You and/or your care partner have signed paperwork which will be entered into your electronic medical record.  These signatures attest to the fact that that the information above on your After Visit Summary has been reviewed and is understood.  Full responsibility of the confidentiality of this discharge information  lies with you and/or your care-partner.

## 2020-07-18 NOTE — Progress Notes (Signed)
Called to room to assist during endoscopic procedure.  Patient ID and intended procedure confirmed with present staff. Received instructions for my participation in the procedure from the performing physician.  

## 2020-07-18 NOTE — Progress Notes (Signed)
pt tolerated well. VSS. awake and to recovery. Report given to RN.  

## 2020-07-18 NOTE — Op Note (Signed)
[place] Patient Name: Deborah Bright Procedure Date: 07/18/2020 9:52 AM MRN: [id number] Endoscopist: Justice Britain , MD Age: 50 Referring MD:  Date of Birth: 1970/10/18 Gender: Female Account #: [id number] Procedure:                Colonoscopy Indications:              Screening for colorectal malignant neoplasm, This                            is the patient's first colonoscopy Medicines:                Monitored Anesthesia Care Procedure:                Pre-Anesthesia Assessment:                           - Prior to the procedure, a History and Physical                            was performed, and patient medications and                            allergies were reviewed. The patient's tolerance of                            previous anesthesia was also reviewed. The risks                            and benefits of the procedure and the sedation                            options and risks were discussed with the patient.                            All questions were answered, and informed consent                            was obtained. Prior Anticoagulants: The patient has                            taken no previous anticoagulant or antiplatelet                            agents. ASA Grade Assessment: II - A patient with                            mild systemic disease. After reviewing the risks                            and benefits, the patient was deemed in                            satisfactory condition to undergo the procedure.  After obtaining informed consent, the colonoscope                            was passed under direct vision. Throughout the                            procedure, the patient's blood pressure, pulse, and                            oxygen saturations were monitored continuously. The                            Olympus CF-[id number] (#[id number]) Colonoscope was                            introduced through the anus  and advanced to the the                            cecum, identified by transillumination. The                            colonoscopy was performed without difficulty. The                            patient tolerated the procedure. The quality of the                            bowel preparation was good. The terminal ileum,                            ileocecal valve, appendiceal orifice, and rectum                            were photographed. Scope In: 9:55:14 AM Scope Out: 10:14:22 AM Scope Withdrawal Time: 0 hours 12 minutes 22 seconds  Total Procedure Duration: 0 hours 19 minutes 8 seconds  Findings:                 The digital rectal exam findings include                            hemorrhoids. Pertinent negatives include no                            palpable rectal lesions.                           The terminal ileum and ileocecal valve appeared                            normal.                           Three sessile polyps were found in the sigmoid  colon, splenic flexure and hepatic flexure. The                            polyps were 2 to 5 mm in size. These polyps were                            removed with a cold snare. Resection and retrieval                            were complete.                           A few small-mouthed diverticula were found in the                            recto-sigmoid colon.                           Normal mucosa was found in the entire colon                            otherwise.                           Non-bleeding non-thrombosed internal hemorrhoids                            were found during retroflexion, during perianal                            exam and during digital exam. The hemorrhoids were                            Grade II (internal hemorrhoids that prolapse but                            reduce spontaneously). Complications:            No immediate complications. Estimated Blood Loss:      Estimated blood loss was minimal. Impression:               - Hemorrhoids found on digital rectal exam.                           - The examined portion of the ileum was normal.                           - Three 2 to 5 mm polyps in the sigmoid colon, at                            the splenic flexure and at the hepatic flexure,                            removed with a cold snare. Resected and retrieved.                           -  Diverticulosis in the recto-sigmoid colon.                           - Normal mucosa in the entire examined colon                            otherwise.                           - Non-bleeding non-thrombosed internal hemorrhoids. Recommendation:           - The patient will be observed post-procedure,                            until all discharge criteria are met.                           - Discharge patient to home.                           - Patient has a contact number available for                            emergencies. The signs and symptoms of potential                            delayed complications were discussed with the                            patient. Return to normal activities tomorrow.                            Written discharge instructions were provided to the                            patient.                           - High fiber diet.                           - Use FiberCon 1-2 tablets PO daily.                           - Continue present medications.                           - Await pathology results.                           - Repeat colonoscopy in 3/09/06/08 years for                            surveillance based on pathology results and                            findings of adenomatous tissue.                           -  The findings and recommendations were discussed                            with the patient.                           - The findings and recommendations were discussed                            with the  designated responsible adult. Justice Britain, MD 07/18/2020 10:18:24 AM

## 2020-07-18 NOTE — Progress Notes (Signed)
VS by CW  I have reviewed the patient's medical history in detail and updated the computerized patient record.  

## 2020-07-22 ENCOUNTER — Telehealth: Payer: Self-pay

## 2020-07-22 NOTE — Telephone Encounter (Signed)
  Follow up Call-  Call back number 07/18/2020  Post procedure Call Back phone  # [phone]  Permission to leave phone message Yes  Some recent data might be hidden     Patient questions:  Do you have a fever, pain , or abdominal swelling? No. Pain Score  0 *  Have you tolerated food without any problems? Yes.    Have you been able to return to your normal activities? Yes.    Do you have any questions about your discharge instructions: Diet   No. Medications  No. Follow up visit  No.  Do you have questions or concerns about your Care? No.  Actions: * If pain score is 4 or above: No action needed, pain <4.  1. Have you developed a fever since your procedure? No   2.   Have you had an respiratory symptoms (SOB or cough) since your procedure? No   3.   Have you tested positive for COVID 19 since your procedure? No   4.   Have you had any family members/close contacts diagnosed with the COVID 19 since your procedure?  No    If yes to any of these questions please route to Joylene John, RN and Joella Prince, RN

## 2020-07-24 ENCOUNTER — Encounter: Payer: Self-pay | Admitting: Gastroenterology

## 2020-10-12 ENCOUNTER — Encounter: Payer: Self-pay | Admitting: Family

## 2021-02-19 ENCOUNTER — Other Ambulatory Visit: Payer: Self-pay | Admitting: Obstetrics & Gynecology

## 2021-02-19 DIAGNOSIS — Z1231 Encounter for screening mammogram for malignant neoplasm of breast: Secondary | ICD-10-CM

## 2021-03-18 ENCOUNTER — Other Ambulatory Visit: Payer: Self-pay

## 2021-03-18 ENCOUNTER — Ambulatory Visit (INDEPENDENT_AMBULATORY_CARE_PROVIDER_SITE_OTHER): Payer: 59 | Admitting: Obstetrics & Gynecology

## 2021-03-18 ENCOUNTER — Encounter: Payer: Self-pay | Admitting: Obstetrics & Gynecology

## 2021-03-18 VITALS — BP 122/76 | HR 81 | Resp 16 | Ht 66.25 in | Wt 197.0 lb

## 2021-03-18 DIAGNOSIS — N951 Menopausal and female climacteric states: Secondary | ICD-10-CM

## 2021-03-18 DIAGNOSIS — E6609 Other obesity due to excess calories: Secondary | ICD-10-CM

## 2021-03-18 DIAGNOSIS — Z01419 Encounter for gynecological examination (general) (routine) without abnormal findings: Secondary | ICD-10-CM | POA: Diagnosis not present

## 2021-03-18 DIAGNOSIS — Z6831 Body mass index (BMI) 31.0-31.9, adult: Secondary | ICD-10-CM

## 2021-03-18 DIAGNOSIS — Z9189 Other specified personal risk factors, not elsewhere classified: Secondary | ICD-10-CM

## 2021-03-18 NOTE — Progress Notes (Signed)
Deborah Bright [birth year] [id number]   History:    50 y.o.  G0 Engaged.  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   RP:  Established patient presenting for annual gyn exam    HPI: Oligomenorrhea every 1-2 months.  No BTB. Mild hot flushes.  No pelvic pain.  No pain with IC. Last Pap 03/2019 Neg. Urine/BMs normal.  Breasts normal.  Mammo scheduled next week.  BMI 31.56.  Walking.  Ketone diet.  Health labs with Fam MD.  Colonoscopy 07/2020.  Past medical history,surgical history, family history and social history were all reviewed and documented in the EPIC chart.  Gynecologic History Patient's last menstrual period was 02/10/2021 (exact date).  Obstetric History OB History  Gravida Para Term Preterm AB Living  0            SAB IAB Ectopic Multiple Live Births                ROS: A ROS was performed and pertinent positives and negatives are included in the history.  GENERAL: No fevers or chills. HEENT: No change in vision, no earache, sore throat or sinus congestion. NECK: No pain or stiffness. CARDIOVASCULAR: No chest pain or pressure. No palpitations. PULMONARY: No shortness of breath, cough or wheeze. GASTROINTESTINAL: No abdominal pain, nausea, vomiting or diarrhea, melena or bright red blood per rectum. GENITOURINARY: No urinary frequency, urgency, hesitancy or dysuria. MUSCULOSKELETAL: No joint or muscle pain, no back pain, no recent trauma. DERMATOLOGIC: No rash, no itching, no lesions. ENDOCRINE: No polyuria, polydipsia, no heat or cold intolerance. No recent change in weight. HEMATOLOGICAL: No anemia or easy bruising or bleeding. NEUROLOGIC: No headache, seizures, numbness, tingling or weakness. PSYCHIATRIC: No depression, no loss of interest in normal activity or change in sleep pattern.     Exam:   BP 122/76   Pulse 81   Resp 16   Ht 5' 6.25" (1.683 m)   Wt 197 lb (89.4 kg)   LMP 02/10/2021 (Exact Date)   BMI 31.56 kg/m   Body mass index is 31.56  kg/m.  General appearance : Well developed well nourished female. No acute distress HEENT: Eyes: no retinal hemorrhage or exudates,  Neck supple, trachea midline, no carotid bruits, no thyroidmegaly Lungs: Clear to auscultation, no rhonchi or wheezes, or rib retractions  Heart: Regular rate and rhythm, no murmurs or gallops Breast:Examined in sitting and supine position were symmetrical in appearance, no palpable masses or tenderness,  no skin retraction, no nipple inversion, no nipple discharge, no skin discoloration, no axillary or supraclavicular lymphadenopathy Abdomen: no palpable masses or tenderness, no rebound or guarding Extremities: no edema or skin discoloration or tenderness  Pelvic: Vulva: Normal             Vagina: No gross lesions or discharge  Cervix: No gross lesions or discharge  Uterus  AV, normal size, shape and consistency, non-tender and mobile  Adnexa  Without masses or tenderness  Anus: Normal   Assessment/Plan:  50 y.o. female for annual exam   1. Well female exam with routine gynecological exam Oligomenorrhea every 1-2 months.  No BTB. Mild hot flushes.  No pelvic pain.  No pain with IC. Last Pap 03/2019 Neg. Will repeat at 3 yrs. Urine/BMs normal.  Breasts normal.  Mammo scheduled next week.  BMI 31.56.  Walking.  Ketone diet.  Health labs with Fam MD.  Colonoscopy 07/2020.  2. Perimenopause Oligomenorrhea every 1-2 months.  Bleeding precautions reviewed.  Perimenopausal Sxs discussed.  3. Relies on partner vasectomy for contraception  4. Class 1 obesity due to excess calories without serious comorbidity with body mass index (BMI) of 31.0 to 31.9 in adult Recommend to continue with a lower calorie/carb diet.  Regular fitness activities.  Lost weight x last year on a Ketone diet.  Other orders - VITAMIN D PO; Take 5,000 Int'l Units by mouth. - Calcium Polycarbophil (FIBERCON PO); Take by mouth.   Princess Bruins MD, 4:42 PM 03/18/2021

## 2021-03-19 ENCOUNTER — Ambulatory Visit: Payer: 59

## 2021-03-19 ENCOUNTER — Encounter: Payer: Self-pay | Admitting: Obstetrics & Gynecology

## 2021-04-08 ENCOUNTER — Ambulatory Visit
Admission: RE | Admit: 2021-04-08 | Discharge: 2021-04-08 | Disposition: A | Discharge status: Home / Self Care | Payer: 59 | Source: Ambulatory Visit | Attending: Obstetrics & Gynecology | Admitting: Obstetrics & Gynecology

## 2021-04-08 DIAGNOSIS — Z1231 Encounter for screening mammogram for malignant neoplasm of breast: Secondary | ICD-10-CM

## 2022-03-29 ENCOUNTER — Other Ambulatory Visit: Payer: Self-pay | Admitting: Obstetrics & Gynecology

## 2022-03-29 DIAGNOSIS — Z1231 Encounter for screening mammogram for malignant neoplasm of breast: Secondary | ICD-10-CM

## 2022-03-31 ENCOUNTER — Ambulatory Visit: Payer: 59 | Admitting: Obstetrics & Gynecology

## 2022-04-05 ENCOUNTER — Ambulatory Visit: Payer: 59 | Admitting: Obstetrics & Gynecology

## 2022-04-15 ENCOUNTER — Other Ambulatory Visit (HOSPITAL_COMMUNITY)
Admission: RE | Admit: 2022-04-15 | Discharge: 2022-04-15 | Disposition: A | Discharge status: Home / Self Care | Payer: 59 | Source: Ambulatory Visit | Attending: Obstetrics & Gynecology | Admitting: Obstetrics & Gynecology

## 2022-04-15 ENCOUNTER — Encounter: Payer: Self-pay | Admitting: Obstetrics & Gynecology

## 2022-04-15 ENCOUNTER — Ambulatory Visit (INDEPENDENT_AMBULATORY_CARE_PROVIDER_SITE_OTHER): Payer: 59 | Admitting: Obstetrics & Gynecology

## 2022-04-15 VITALS — BP 124/80 | Ht 68.0 in

## 2022-04-15 DIAGNOSIS — Z01419 Encounter for gynecological examination (general) (routine) without abnormal findings: Secondary | ICD-10-CM

## 2022-04-15 DIAGNOSIS — N951 Menopausal and female climacteric states: Secondary | ICD-10-CM

## 2022-04-15 DIAGNOSIS — Z9189 Other specified personal risk factors, not elsewhere classified: Secondary | ICD-10-CM

## 2022-04-15 NOTE — Progress Notes (Signed)
Deborah Bright July 26, 1970 [id number]   History:    51 y.o. G0 Married.  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   RP:  Established patient presenting for annual gyn exam    HPI: Oligomenorrhea every 3-7 months.  LMP 08/24/21. No BTB. Hot flushes helped on [place].  No pelvic pain. No pain with IC. Last Pap 03/2019 Neg.  Pap/HPV HR today.  Urine/BMs normal.  Breasts normal.  Mammo Neg 04/08/21.  Scheduled mammo 05/2022.  BMI improved to 29.95. Ketone diet.  Health labs with Fam MD.  Colonoscopy 07/2020.  Past medical history,surgical history, family history and social history were all reviewed and documented in the EPIC chart.  Gynecologic History Patient's last menstrual period was 08/24/2021.  Obstetric History OB History  Gravida Para Term Preterm AB Living  0 0 0 0 0 0  SAB IAB Ectopic Multiple Live Births  0 0 0 0 0     ROS: A ROS was performed and pertinent positives and negatives are included in the history. GENERAL: No fevers or chills. HEENT: No change in vision, no earache, sore throat or sinus congestion. NECK: No pain or stiffness. CARDIOVASCULAR: No chest pain or pressure. No palpitations. PULMONARY: No shortness of breath, cough or wheeze. GASTROINTESTINAL: No abdominal pain, nausea, vomiting or diarrhea, melena or bright red blood per rectum. GENITOURINARY: No urinary frequency, urgency, hesitancy or dysuria. MUSCULOSKELETAL: No joint or muscle pain, no back pain, no recent trauma. DERMATOLOGIC: No rash, no itching, no lesions. ENDOCRINE: No polyuria, polydipsia, no heat or cold intolerance. No recent change in weight. HEMATOLOGICAL: No anemia or easy bruising or bleeding. NEUROLOGIC: No headache, seizures, numbness, tingling or weakness. PSYCHIATRIC: No depression, no loss of interest in normal activity or change in sleep pattern.     Exam:   BP 124/80 (BP Location: Right Arm, Patient Position: Sitting, Cuff Size: Large)   Ht '5\' 8"'$  (1.727 m)   LMP 08/24/2021    BMI 29.95 kg/m   Body mass index is 29.95 kg/m.  General appearance : Well developed well nourished female. No acute distress HEENT: Eyes: no retinal hemorrhage or exudates,  Neck supple, trachea midline, no carotid bruits, no thyroidmegaly Lungs: Clear to auscultation, no rhonchi or wheezes, or rib retractions  Heart: Regular rate and rhythm, no murmurs or gallops Breast:Examined in sitting and supine position were symmetrical in appearance, no palpable masses or tenderness,  no skin retraction, no nipple inversion, no nipple discharge, no skin discoloration, no axillary or supraclavicular lymphadenopathy Abdomen: no palpable masses or tenderness, no rebound or guarding Extremities: no edema or skin discoloration or tenderness  Pelvic: Vulva: Normal             Vagina: No gross lesions or discharge  Cervix: No gross lesions or discharge.  Pap/HPV HR done.  Uterus  AV, normal size, shape and consistency, non-tender and mobile  Adnexa  Without masses or tenderness  Anus: Normal   Assessment/Plan:  51 y.o. female for annual exam   1. Encounter for routine gynecological examination with Papanicolaou smear of cervix Oligomenorrhea every 3-7 months.  LMP 08/24/21. No BTB. Hot flushes helped on [place].  No pelvic pain. No pain with IC. Last Pap 03/2019 Neg.  Pap/HPV HR today.  Urine/BMs normal.  Breasts normal.  Mammo Neg 04/08/21.  Scheduled mammo 05/2022.  BMI improved to 29.95. Ketone diet.  Health labs with Fam MD.  Colonoscopy 07/2020. - Cytology - PAP( [facility])  2. Relies on partner vasectomy for  contraception  3. Perimenopause Oligomenorrhea every 3-7 months.  LMP 08/24/21. No BTB. Hot flushes helped on [place]. No pelvic pain. No pain with IC. Abnormal bleeding precautions reviewed.  Other orders - Multiple Vitamin (MULTIVITAMIN) capsule; Take 1 capsule by mouth daily. - BLACK COHOSH PO; Take by mouth.   Princess Bruins MD, 9:53 AM

## 2022-04-20 LAB — CYTOLOGY - PAP
Comment: NEGATIVE
Diagnosis: NEGATIVE
High risk HPV: NEGATIVE

## 2022-05-21 ENCOUNTER — Ambulatory Visit
Admission: RE | Admit: 2022-05-21 | Discharge: 2022-05-21 | Disposition: A | Discharge status: Home / Self Care | Payer: 59 | Source: Ambulatory Visit | Attending: Obstetrics & Gynecology | Admitting: Obstetrics & Gynecology

## 2022-05-21 DIAGNOSIS — Z1231 Encounter for screening mammogram for malignant neoplasm of breast: Secondary | ICD-10-CM

## 2022-10-17 IMAGING — MG DIGITAL SCREENING BREAST BILAT IMPLANT W/ TOMO W/ CAD
8 of 12 series · 8 of 28 positions shown · non-contrast
Comparison: Previous exam(s).

CLINICAL DATA: Screening.

EXAM:
DIGITAL SCREENING BILATERAL MAMMOGRAM WITH IMPLANTS, CAD AND
TOMOSYNTHESIS
TECHNIQUE: Bilateral screening digital craniocaudal and mediolateral oblique
mammograms were obtained. Bilateral screening digital breast
tomosynthesis was performed. The images were evaluated with
computer-aided detection. Standard and/or implant displaced views
were performed.

[R MLO]
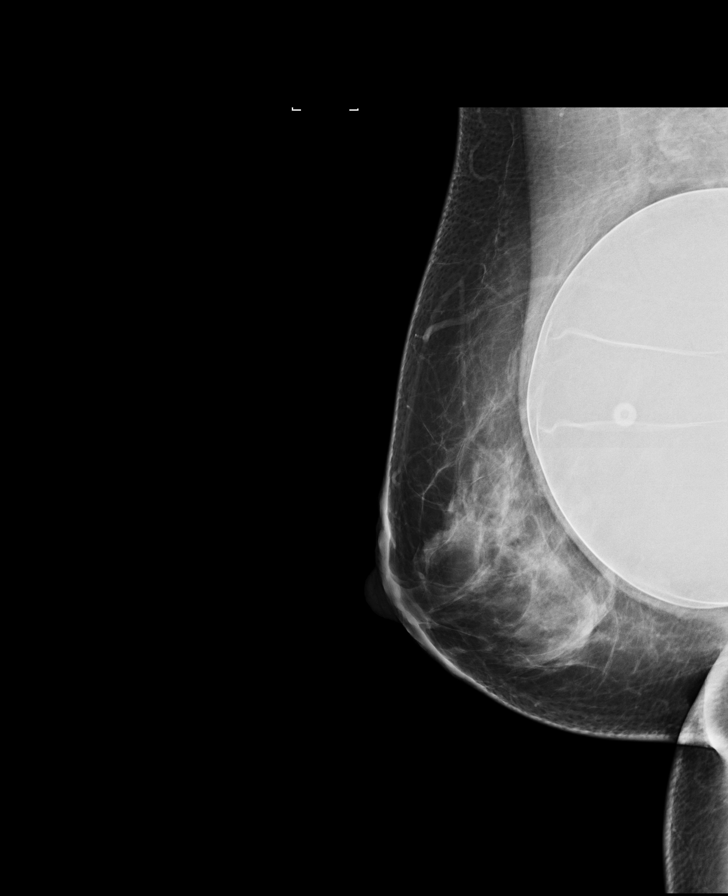

[L MLO]
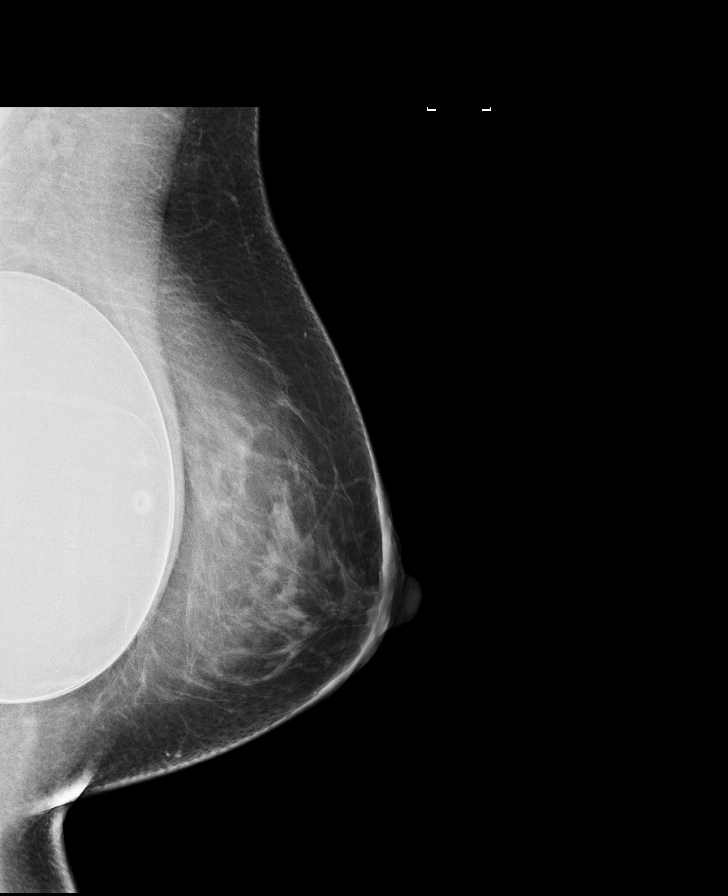

[R CC]
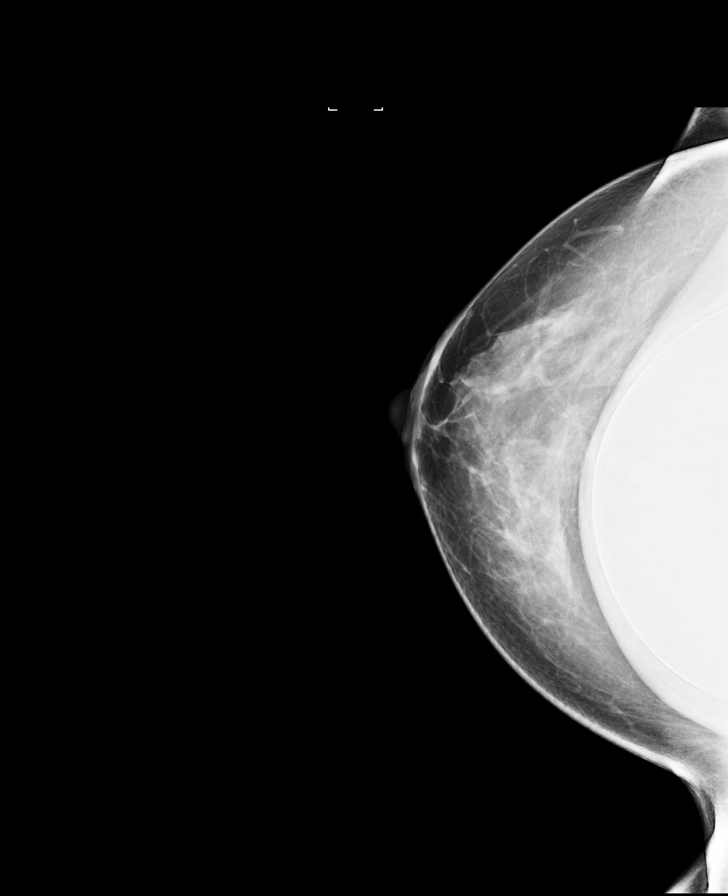

[L CC]
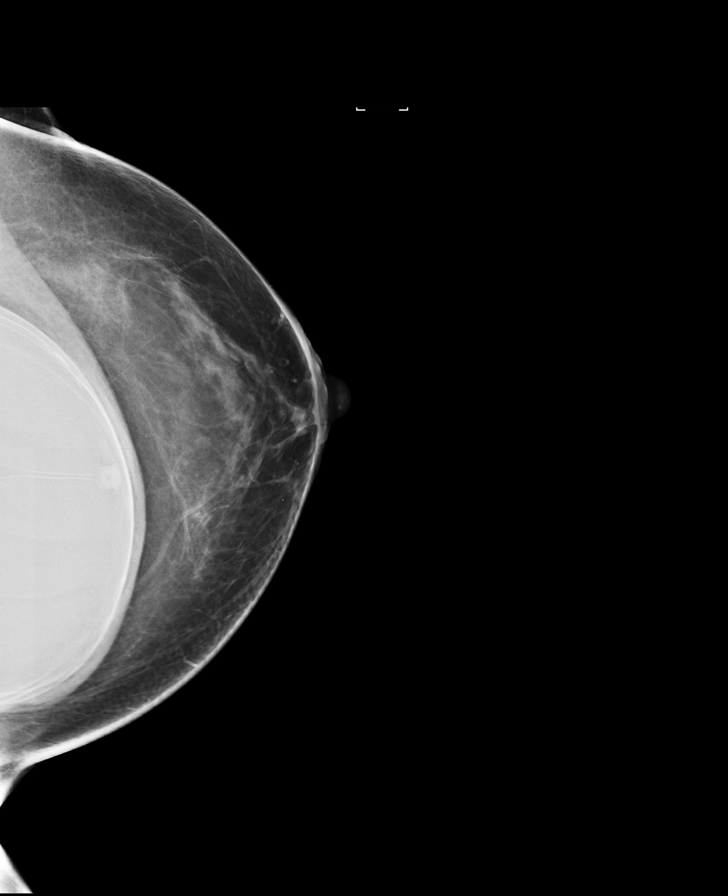

[R MLO synth-2D]
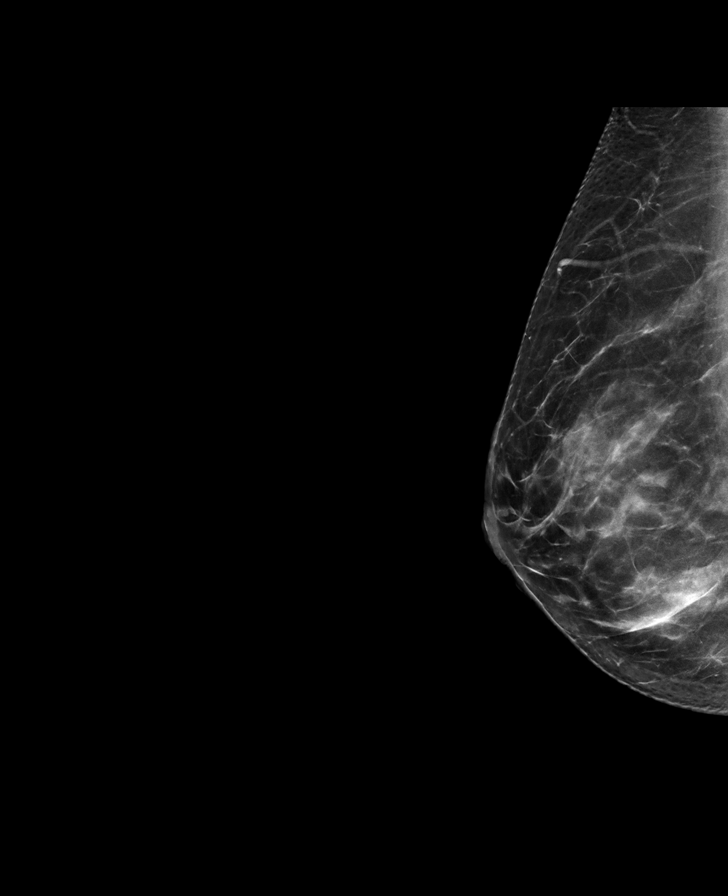

[R CC synth-2D]
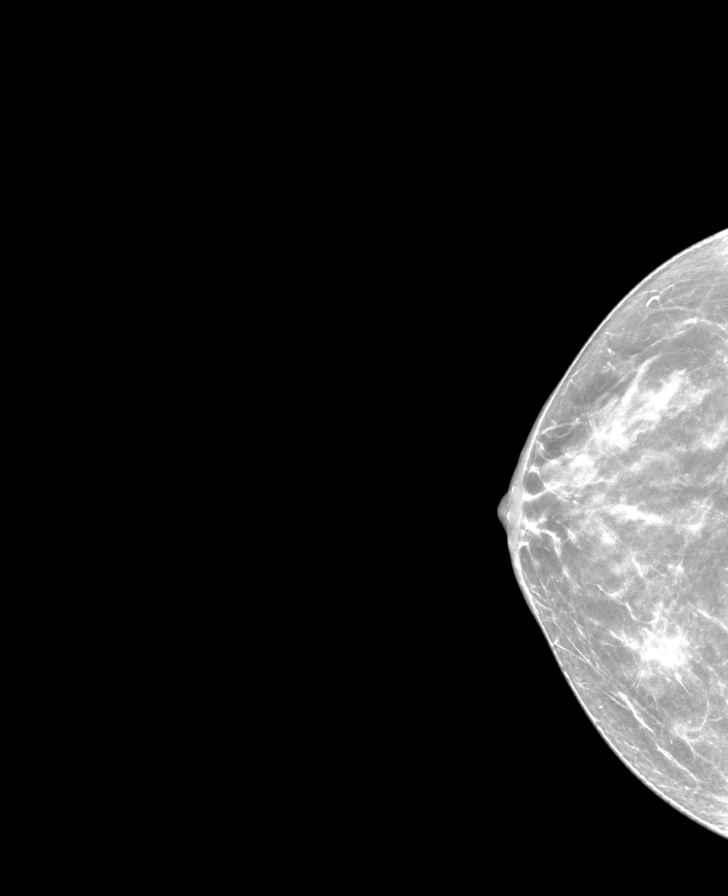

[L MLO synth-2D]
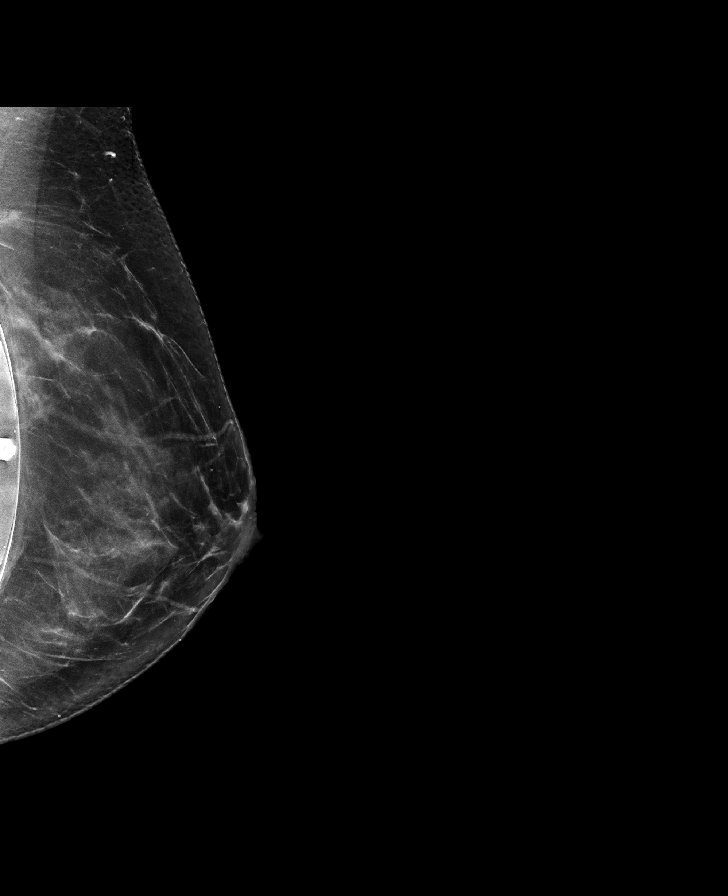

[L CC synth-2D]
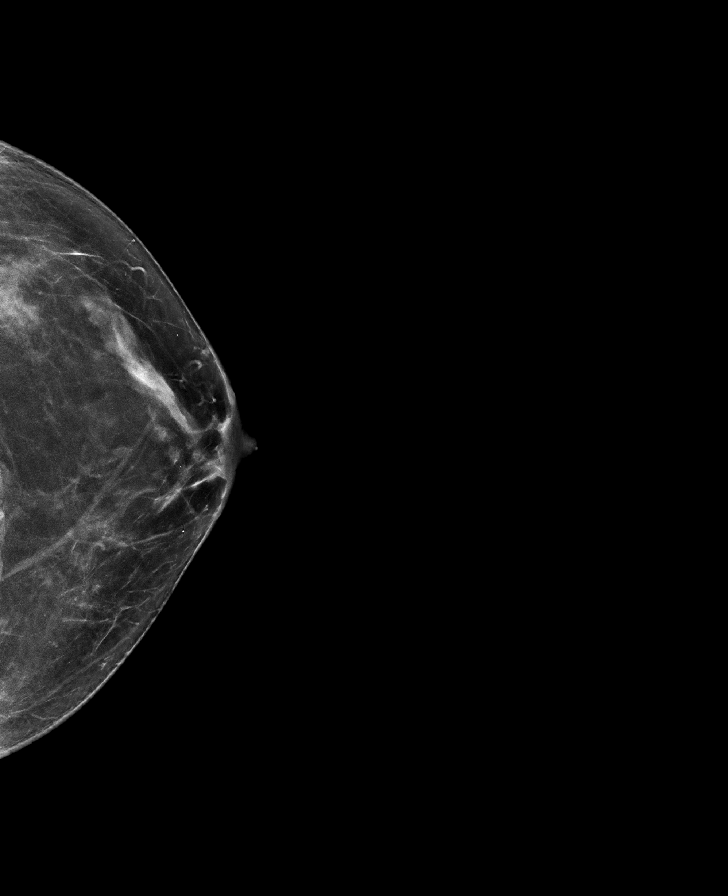

[8 of 28 positions shown; findings below may reference images not displayed]

ACR Breast Density Category b: There are scattered areas of
fibroglandular density.
FINDINGS: The patient has retropectoral implants. There are no findings
suspicious for malignancy.
IMPRESSION: No mammographic evidence of malignancy. A result letter of this
screening mammogram will be mailed directly to the patient.

RECOMMENDATION:
Screening mammogram in one year. (Code:[id number]-[id number])

BI-RADS CATEGORY  1:  Negative.

## 2022-12-02 ENCOUNTER — Ambulatory Visit: Payer: 59 | Admitting: Family

## 2022-12-02 ENCOUNTER — Encounter: Payer: Self-pay | Admitting: Family

## 2022-12-02 VITALS — BP 118/74 | HR 87 | Temp 98.8°F | Ht 68.0 in | Wt 202.0 lb

## 2022-12-02 DIAGNOSIS — Z0000 Encounter for general adult medical examination without abnormal findings: Secondary | ICD-10-CM | POA: Diagnosis not present

## 2022-12-02 DIAGNOSIS — R7303 Prediabetes: Secondary | ICD-10-CM | POA: Diagnosis not present

## 2022-12-02 DIAGNOSIS — N951 Menopausal and female climacteric states: Secondary | ICD-10-CM | POA: Diagnosis not present

## 2022-12-02 DIAGNOSIS — Z13220 Encounter for screening for lipoid disorders: Secondary | ICD-10-CM | POA: Diagnosis not present

## 2022-12-02 LAB — LDL CHOLESTEROL, DIRECT: Direct LDL: 151 mg/dL

## 2022-12-02 LAB — TSH: TSH: 1.65 u[IU]/mL (ref 0.35–5.50)

## 2022-12-02 LAB — COMPREHENSIVE METABOLIC PANEL
ALT: 28 U/L (ref 0–35)
AST: 22 U/L (ref 0–37)
Albumin: 4.7 g/dL (ref 3.5–5.2)
Alkaline Phosphatase: 81 U/L (ref 39–117)
BUN: 20 mg/dL (ref 6–23)
CO2: 30 mEq/L (ref 19–32)
Calcium: 9.9 mg/dL (ref 8.4–10.5)
Chloride: 100 mEq/L (ref 96–112)
Creatinine, Ser: 0.91 mg/dL (ref 0.40–1.20)
GFR: 72.4 mL/min (ref >60.00)
Glucose, Bld: 123 mg/dL — ABNORMAL HIGH (ref 70–99)
Potassium: 5.1 mEq/L (ref 3.5–5.1)
Sodium: 139 mEq/L (ref 135–145)
Total Bilirubin: 0.9 mg/dL (ref 0.2–1.2)
Total Protein: 7 g/dL (ref 6.0–8.3)

## 2022-12-02 LAB — CBC WITH DIFFERENTIAL/PLATELET
Basophils Absolute: 0 10*3/uL (ref 0.0–0.1)
Basophils Relative: 0.6 % (ref 0.0–3.0)
Eosinophils Absolute: 0.1 10*3/uL (ref 0.0–0.7)
Eosinophils Relative: 1.7 % (ref 0.0–5.0)
HCT: 46 % (ref 36.0–46.0)
Hemoglobin: 14.9 g/dL (ref 12.0–15.0)
Lymphocytes Relative: 39.8 % (ref 12.0–46.0)
Lymphs Abs: 3.1 10*3/uL (ref 0.7–4.0)
MCHC: 32.5 g/dL (ref 30.0–36.0)
MCV: 93.7 fl (ref 78.0–100.0)
Monocytes Absolute: 0.7 10*3/uL (ref 0.1–1.0)
Monocytes Relative: 8.7 % (ref 3.0–12.0)
Neutro Abs: 3.8 10*3/uL (ref 1.4–7.7)
Neutrophils Relative %: 49.2 % (ref 43.0–77.0)
Platelets: 263 10*3/uL (ref 150.0–400.0)
RBC: 4.91 Mil/uL (ref 3.87–5.11)
RDW: 12.9 % (ref 11.5–15.5)
WBC: 7.7 10*3/uL (ref 4.0–10.5)

## 2022-12-02 LAB — LIPID PANEL
Cholesterol: 301 mg/dL — ABNORMAL HIGH (ref 0–200)
HDL: 49.2 mg/dL (ref >39.00)
NonHDL: 251.72
Total CHOL/HDL Ratio: 6
Triglycerides: 352 mg/dL — ABNORMAL HIGH (ref 0.0–149.0)
VLDL: 70.4 mg/dL — ABNORMAL HIGH (ref 0.0–40.0)

## 2022-12-02 LAB — HEMOGLOBIN A1C: Hgb A1c MFr Bld: 6.5 % (ref 4.6–6.5)

## 2022-12-02 NOTE — Progress Notes (Signed)
Deborah Bright is a 52 y.o. female with the following history as recorded in EpicCare:  Patient Active Problem List   Diagnosis Date Noted   Weight gain 01/04/2014   Other mechanical complication of intrauterine contraceptive device 01/04/2014   Lipoma 08/17/2012    Current Outpatient Medications  Medication Sig Dispense Refill   BLACK COHOSH PO Take by mouth.     Calcium Polycarbophil (FIBERCON PO) Take by mouth.     Multiple Vitamin (MULTIVITAMIN) capsule Take 1 capsule by mouth daily.     No current facility-administered medications for this visit.    Allergies: Codeine, Hydrocodone, and Percocet [oxycodone-acetaminophen]  Past Medical History:  Diagnosis Date   Bronchitis    history   Chronic kidney disease    kidney stone   PONV (postoperative nausea and vomiting)    Seasonal allergies     Past Surgical History:  Procedure Laterality Date   AUGMENTATION MAMMAPLASTY Bilateral 2000   saline    BREAST ENHANCEMENT SURGERY  1999   bilat   CARPAL TUNNEL RELEASE Bilateral 2017   TRIGGER THUMB RELEASE   CYSTOSCOPY W/ STONE MANIPULATION  1994   dental implants     HIP SURGERY  APRIL 2014   HEMATOMA   HYSTEROSCOPY WITH D & C N/A 01/11/2014   Procedure: DILATATION AND CURETTAGE /HYSTEROSCOPY WITH REMOVAL OF IUD FRAGMENT;  Surgeon: Ok Edwards, MD;  Location: [facility];  Service: Gynecology;  Laterality: N/A;   KNEE ARTHROSCOPY  1991   right   KNEE ARTHROSCOPY  1992   left   MASS EXCISION Right 08/17/2012   Procedure: EXCISION OF RIGHT LEG FAT NECROSIS;  Surgeon: Wayland Denis, DO;  Location: [facility];  Service: Plastics;  Laterality: Right;   MOUTH SURGERY  10/2016   IMPLANT    TONSILLECTOMY  1984    Family History  Problem Relation Age of Onset   Cancer Maternal Grandfather        LUNG    Colon cancer Maternal Grandfather    Cancer Paternal Grandfather        PROSTATE   Breast cancer Paternal Grandmother    Diabetes Paternal Grandmother     Transient ischemic attack Maternal Grandmother    Colon polyps Mother    Esophageal cancer Neg Hx    Rectal cancer Neg Hx    Stomach cancer Neg Hx     Social History   Tobacco Use   Smoking status: Former    Current packs/day: 0.00    Types: Cigarettes    Start date: 17    Quit date: 2014    Years since quitting: 10.5   Smokeless tobacco: Never  Substance Use Topics   Alcohol use: Yes    Comment: 0-4 drinks a week    Subjective:   Presents for yearly CPE; has not been seen in over 2 years; majority of care has been managed by her GYN; needs documented CPE for her employer;   Health Maintenance  Topic Date Due   COVID-19 Vaccine (1) Never done   Hepatitis C Screening  Never done   DTaP/Tdap/Td (1 - Tdap) Never done   Zoster Vaccines- Shingrix (1 of 2) Never done   INFLUENZA VACCINE  12/02/2022   MAMMOGRAM  05/21/2024   PAP SMEAR-Modifier  04/15/2025   Colonoscopy  07/19/2027   HIV Screening  Completed   HPV VACCINES  Aged Out   Review of Systems  Constitutional: Negative.   HENT: Negative.    Eyes: Negative.  Respiratory: Negative.    Cardiovascular: Negative.   Gastrointestinal: Negative.   Genitourinary: Negative.   Musculoskeletal: Negative.   Skin: Negative.   Neurological: Negative.   Endo/Heme/Allergies: Negative.   Psychiatric/Behavioral: Negative.        Objective:  Vitals:   12/02/22 0837  BP: 118/74  Pulse: 87  Temp: 98.8 F (37.1 C)  TempSrc: Oral  SpO2: 95%  Weight: 202 lb (91.6 kg)  Height: 5\' 8"  (1.727 m)    General: Well developed, well nourished, in no acute distress  Skin : Warm and dry.  Head: Normocephalic and atraumatic  Eyes: Sclera and conjunctiva clear; pupils round and reactive to light; extraocular movements intact  Ears: External normal; canals clear; tympanic membranes normal  Oropharynx: Pink, supple. No suspicious lesions  Neck: Supple without thyromegaly, adenopathy  Lungs: Respirations unlabored; clear to  auscultation bilaterally without wheeze, rales, rhonchi  CVS exam: normal rate and regular rhythm.  Abdomen: Soft; nontender; nondistended; normoactive bowel sounds; no masses or hepatosplenomegaly  Musculoskeletal: No deformities; no active joint inflammation  Extremities: No edema, cyanosis, clubbing  Vessels: Symmetric bilaterally  Neurologic: Alert and oriented; speech intact; face symmetrical; moves all extremities well; CNII-XII intact without focal deficit   Assessment:  1. PE (physical exam), annual   2. Lipid screening   3. Pre-diabetes   4. Menopausal symptoms     Plan:  Age appropriate preventive healthcare needs addressed; encouraged regular eye doctor and dental exams; encouraged regular exercise; will update labs and refills as needed today; follow-up to be determined; Patient is planning to establish at [facility] for discussion about menopause management; agree this is appropriate; she will follow up with further questions or concerns after that meeting;  Follow up in 1 year, sooner prn.   No follow-ups on file.  Orders Placed This Encounter  Procedures   CBC with Differential/Platelet   Comp Met (CMET)   Lipid panel   TSH   Hemoglobin A1c    Requested Prescriptions    No prescriptions requested or ordered in this encounter

## 2022-12-23 ENCOUNTER — Ambulatory Visit: Payer: 59 | Admitting: Family Medicine

## 2023-02-25 ENCOUNTER — Other Ambulatory Visit: Payer: Self-pay | Admitting: Family

## 2023-02-25 DIAGNOSIS — Z0000 Encounter for general adult medical examination without abnormal findings: Secondary | ICD-10-CM

## 2023-04-28 ENCOUNTER — Encounter: Payer: Self-pay | Admitting: Family

## 2023-05-05 ENCOUNTER — Ambulatory Visit: Payer: 59 | Admitting: Obstetrics & Gynecology

## 2023-05-05 ENCOUNTER — Ambulatory Visit (INDEPENDENT_AMBULATORY_CARE_PROVIDER_SITE_OTHER): Payer: 59 | Admitting: Obstetrics and Gynecology

## 2023-05-05 ENCOUNTER — Encounter: Payer: Self-pay | Admitting: Obstetrics and Gynecology

## 2023-05-05 VITALS — BP 108/70 | HR 75 | Ht 66.14 in | Wt 190.0 lb

## 2023-05-05 DIAGNOSIS — Z01419 Encounter for gynecological examination (general) (routine) without abnormal findings: Secondary | ICD-10-CM | POA: Diagnosis not present

## 2023-05-05 NOTE — Patient Instructions (Signed)
 For patients under 50-54yo, I recommend 1200mg  calcium  daily and 600IU of vitamin D daily. For patients over 54yo, I recommend 1200mg  calcium  daily and 800IU of vitamin D daily.  Health Maintenance, Female Adopting a healthy lifestyle and getting preventive care are important in promoting health and wellness. Ask your health care provider about: The right schedule for you to have regular tests and exams. Things you can do on your own to prevent diseases and keep yourself healthy. What should I know about diet, weight, and exercise? Eat a healthy diet  Eat a diet that includes plenty of vegetables, fruits, low-fat dairy products, and lean protein. Do not eat a lot of foods that are high in solid fats, added sugars, or sodium. Maintain a healthy weight Body mass index (BMI) is used to identify weight problems. It estimates body fat based on height and weight. Your health care provider can help determine your BMI and help you achieve or maintain a healthy weight. Get regular exercise Get regular exercise. This is one of the most important things you can do for your health. Most adults should: Exercise for at least 150 minutes each week. The exercise should increase your heart rate and make you sweat (moderate-intensity exercise). Do strengthening exercises at least twice a week. This is in addition to the moderate-intensity exercise. Spend less time sitting. Even light physical activity can be beneficial. Watch cholesterol and blood lipids Have your blood tested for lipids and cholesterol at 54 years of age, then have this test every 5 years. Have your cholesterol levels checked more often if: Your lipid or cholesterol levels are high. You are older than 54 years of age. You are at high risk for heart disease. What should I know about cancer screening? Depending on your health history and family history, you may need to have cancer screening at various ages. This may include screening  for: Breast cancer. Cervical cancer. Colorectal cancer. Skin cancer. Lung cancer. What should I know about heart disease, diabetes, and high blood pressure? Blood pressure and heart disease High blood pressure causes heart disease and increases the risk of stroke. This is more likely to develop in people who have high blood pressure readings or are overweight. Have your blood pressure checked: Every 3-5 years if you are 25-57 years of age. Every year if you are 24 years old or older. Diabetes Have regular diabetes screenings. This checks your fasting blood sugar level. Have the screening done: Once every three years after age 62 if you are at a normal weight and have a low risk for diabetes. More often and at a younger age if you are overweight or have a high risk for diabetes. What should I know about preventing infection? Hepatitis B If you have a higher risk for hepatitis B, you should be screened for this virus. Talk with your health care provider to find out if you are at risk for hepatitis B infection. Hepatitis C Testing is recommended for: Everyone born from [birth year] through 1965. Anyone with known risk factors for hepatitis C. Sexually transmitted infections (STIs) Get screened for STIs, including gonorrhea and chlamydia, if: You are sexually active and are younger than 54 years of age. You are older than 54 years of age and your health care provider tells you that you are at risk for this type of infection. Your sexual activity has changed since you were last screened, and you are at increased risk for chlamydia or gonorrhea. Ask your health care provider if  you are at risk. Ask your health care provider about whether you are at high risk for HIV. Your health care provider may recommend a prescription medicine to help prevent HIV infection. If you choose to take medicine to prevent HIV, you should first get tested for HIV. You should then be tested every 3 months for as long as you  are taking the medicine. Osteoporosis and menopause Osteoporosis is a disease in which the bones lose minerals and strength with aging. This can result in bone fractures. If you are 72 years old or older, or if you are at risk for osteoporosis and fractures, ask your health care provider if you should: Be screened for bone loss. Take a calcium  or vitamin D supplement to lower your risk of fractures. Be given hormone replacement therapy (HRT) to treat symptoms of menopause. Follow these instructions at home: Alcohol use Do not drink alcohol if: Your health care provider tells you not to drink. You are pregnant, may be pregnant, or are planning to become pregnant. If you drink alcohol: Limit how much you have to: 0-1 drink a day. Know how much alcohol is in your drink. In the [place] one drink equals one 12 oz bottle of beer (355 mL), one 5 oz glass of wine (148 mL), or one 1 oz glass of hard liquor (44 mL). Lifestyle Do not use any products that contain nicotine or tobacco. These products include cigarettes, chewing tobacco, and vaping devices, such as e-cigarettes. If you need help quitting, ask your health care provider. Do not use street drugs. Do not share needles. Ask your health care provider for help if you need support or information about quitting drugs. General instructions Schedule regular health, dental, and eye exams. Stay current with your vaccines. Tell your health care provider if: You often feel depressed. You have ever been abused or do not feel safe at home. Summary Adopting a healthy lifestyle and getting preventive care are important in promoting health and wellness. Follow your health care provider's instructions about healthy diet, exercising, and getting tested or screened for diseases. Follow your health care provider's instructions on monitoring your cholesterol and blood pressure. This information is not intended to replace advice given to you by your health  care provider. Make sure you discuss any questions you have with your health care provider. Document Revised: 09/08/2020 Document Reviewed: 09/08/2020 Elsevier Patient Education  2024 [employer].

## 2023-05-05 NOTE — Assessment & Plan Note (Signed)
 Cervical cancer screening performed according to ASCCP guidelines. Encouraged annual mammogram screening Colonoscopy UTD DXA never Labs and immunizations with her primary Encouraged safe sexual practices as indicated Encouraged healthy lifestyle practices with diet and exercise For patients under 50-54yo, I recommend 1200mg  calcium daily and 600IU of vitamin D daily.

## 2023-05-05 NOTE — Progress Notes (Signed)
 53 y.o. G0P0000 female here for annual exam. Married, vasectomy. 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  Doing really well on it.  Has follow-up with them on Monday.  Patient's last menstrual period was 08/28/2021.   Abnormal bleeding: None Pelvic discharge or pain: None Breast mass, nipple discharge or skin changes : None Birth control: Vasectomy Last PAP:     Component Value Date/Time   DIAGPAP  04/15/2022 1021    - Negative for intraepithelial lesion or malignancy (NILM)   HPVHIGH Negative 04/15/2022 1021   ADEQPAP  04/15/2022 1021    Satisfactory for evaluation; transformation zone component PRESENT.   Last mammogram: 05/21/22 BIRADS 1, density b Last colonoscopy: 07/2020 Sexually active: Yes Exercising: No, recently switched to night shift so is working on schedule Smoker: Former, no  GYN HISTORY: No significant history  OB History  Gravida Para Term Preterm AB Living  0 0 0 0 0 0  SAB IAB Ectopic Multiple Live Births  0 0 0 0 0    Past Medical History:  Diagnosis Date   Bronchitis    history   Chronic kidney disease    kidney stone   PONV (postoperative nausea and vomiting)    Seasonal allergies     Past Surgical History:  Procedure Laterality Date   AUGMENTATION MAMMAPLASTY Bilateral 2000   saline    BREAST ENHANCEMENT SURGERY  1999   bilat   CARPAL TUNNEL RELEASE Bilateral 2017   TRIGGER THUMB RELEASE   CYSTOSCOPY W/ STONE MANIPULATION  1994   dental implants     HIP SURGERY  APRIL 2014   HEMATOMA   HYSTEROSCOPY WITH D & C N/A 01/11/2014   Procedure: DILATATION AND CURETTAGE /HYSTEROSCOPY WITH REMOVAL OF IUD FRAGMENT;  Surgeon: Curlee VEAR Guan, MD;  Location: [facility];  Service: Gynecology;  Laterality: N/A;   KNEE ARTHROSCOPY  1991   right   KNEE ARTHROSCOPY  1992   left   MASS EXCISION Right 08/17/2012   Procedure: EXCISION OF RIGHT LEG FAT NECROSIS;  Surgeon: Estefana Reichert, DO;  Location: [facility];   Service: Plastics;  Laterality: Right;   MOUTH SURGERY  10/2016   IMPLANT    TONSILLECTOMY  1984    Current Outpatient Medications on File Prior to Visit  Medication Sig Dispense Refill   amoxicillin-clavulanate (AUGMENTIN) 875-125 MG tablet Take 1 tablet by mouth 2 (two) times daily.     ESTRADIOL TD Place onto the skin.     Multiple Vitamin (MULTIVITAMIN) capsule Take 1 capsule by mouth daily.     progesterone  (PROMETRIUM ) 100 MG capsule Take 100 mg by mouth at bedtime.     TESTOSTERONE TD Place onto the skin.     Semaglutide-Weight Management (WEGOVY) 0.5 MG/0.5ML SOAJ Inject 0.5 mg into the skin.     No current facility-administered medications on file prior to visit.    Social History   Socioeconomic History   Marital status: Married    Spouse name: Not on file   Number of children: Not on file   Years of education: Not on file   Highest education level: Not on file  Occupational History   Not on file  Tobacco Use   Smoking status: Former    Current packs/day: 0.00    Types: Cigarettes    Start date: 27    Quit date: 2014    Years since quitting: 11.0   Smokeless tobacco: Never  Vaping Use   Vaping  status: Never Used  Substance and Sexual Activity   Alcohol use: Yes    Comment: 0-4 drinks a week   Drug use: No   Sexual activity: Yes    Partners: Male    Birth control/protection: Other-see comments    Comment: 1ST INTERCOURSE- 65, PARTNERS- 6, husband vasectomy  Other Topics Concern   Not on file  Social History Narrative   Not on file   Social Drivers of Health   Financial Resource Strain: Not on file  Food Insecurity: Not on file  Transportation Needs: Not on file  Physical Activity: Not on file  Stress: Not on file  Social Connections: Not on file  Intimate Partner Violence: Not on file    Family History  Problem Relation Age of Onset   Cancer Maternal Grandfather        LUNG    Colon cancer Maternal Grandfather    Cancer Paternal  Grandfather        PROSTATE   Breast cancer Paternal Grandmother    Diabetes Paternal Grandmother    Transient ischemic attack Maternal Grandmother    Colon polyps Mother    Esophageal cancer Neg Hx    Rectal cancer Neg Hx    Stomach cancer Neg Hx     Allergies  Allergen Reactions   Codeine Nausea And Vomiting   Hydrocodone Itching   Percocet [Oxycodone -Acetaminophen ] Rash      PE Today's Vitals   05/05/23 0810  BP: 108/70  Pulse: 75  SpO2: 96%  Weight: 190 lb (86.2 kg)  Height: 5' 6.14 (1.68 m)   Body mass index is 30.54 kg/m.  Physical Exam Vitals reviewed. Exam conducted with a chaperone present.  Constitutional:      General: She is not in acute distress.    Appearance: Normal appearance.  HENT:     Head: Normocephalic and atraumatic.     Nose: Nose normal.  Eyes:     Extraocular Movements: Extraocular movements intact.     Conjunctiva/sclera: Conjunctivae normal.  Neck:     Thyroid: No thyroid mass, thyromegaly or thyroid tenderness.  Pulmonary:     Effort: Pulmonary effort is normal.  Chest:     Chest wall: No mass or tenderness.  Breasts:    Right: Normal. No swelling, mass, nipple discharge, skin change or tenderness.     Left: Normal. No swelling, mass, nipple discharge, skin change or tenderness.  Abdominal:     General: There is no distension.     Palpations: Abdomen is soft.     Tenderness: There is no abdominal tenderness.  Genitourinary:    General: Normal vulva.     Exam position: Lithotomy position.     Urethra: No prolapse.     Vagina: Normal. No vaginal discharge or bleeding.     Cervix: Normal. No lesion.     Uterus: Normal. Not enlarged and not tender.      Adnexa: Right adnexa normal and left adnexa normal.  Musculoskeletal:        General: Normal range of motion.     Cervical back: Normal range of motion.  Lymphadenopathy:     Upper Body:     Right upper body: No axillary adenopathy.     Left upper body: No axillary  adenopathy.     Lower Body: No right inguinal adenopathy. No left inguinal adenopathy.  Skin:    General: Skin is warm and dry.  Neurological:     General: No focal deficit present.  Mental Status: She is alert.  Psychiatric:        Mood and Affect: Mood normal.        Behavior: Behavior normal.      Assessment and Plan:        Well woman exam with routine gynecological exam Assessment & Plan: Cervical cancer screening performed according to ASCCP guidelines. Encouraged annual mammogram screening Colonoscopy UTD DXA never Labs and immunizations with her primary Encouraged safe sexual practices as indicated Encouraged healthy lifestyle practices with diet and exercise For patients under 50-70yo, I recommend 1200mg  calcium daily and 600IU of vitamin D  daily.     Vera LULLA Pa, MD

## 2023-05-24 ENCOUNTER — Ambulatory Visit
Admission: RE | Admit: 2023-05-24 | Discharge: 2023-05-24 | Disposition: A | Discharge status: Home / Self Care | Payer: 59 | Source: Ambulatory Visit | Attending: Family | Admitting: Family

## 2023-05-24 DIAGNOSIS — Z0000 Encounter for general adult medical examination without abnormal findings: Secondary | ICD-10-CM

## 2024-01-03 ENCOUNTER — Telehealth: Payer: Self-pay

## 2024-01-03 NOTE — Telephone Encounter (Signed)
 Copied from CRM #[id number]. Topic: Appointments - Scheduling Inquiry for Clinic >> Jan 03, 2024 10:58 AM Mesmerise C wrote: Reason for CRM: Patient would like a physical scheduled has to be done before November has a  TOC appointment set up due to Dr. Jason leaving when trying to schedule still populating [facility] patient can be reached at [phone] if can be scheduled

## 2024-01-03 NOTE — Telephone Encounter (Signed)
 Copied from CRM #[id number]. Topic: Appointments - Transfer of Care >> Jan 03, 2024 10:56 AM Mesmerise C wrote: Pt is requesting to transfer FROM: Dr.Murray Pt is requesting to transfer TO: Dr. Almarie Reason for requested transfer: Jason is leaving It is the responsibility of the team the patient would like to transfer to (Dr. Almarie) to reach out to the patient if for any reason this transfer is not acceptable.

## 2024-01-03 NOTE — Telephone Encounter (Signed)
 Scheduled in October

## 2024-01-04 NOTE — Telephone Encounter (Signed)
 I do not see any availably on your schedule for a CPE before your departure, is there some where you would like to squeeze pt in?

## 2024-02-10 ENCOUNTER — Encounter: Admitting: Family Medicine

## 2024-05-07 ENCOUNTER — Ambulatory Visit: Payer: 59 | Admitting: Obstetrics and Gynecology

## 2024-05-22 ENCOUNTER — Other Ambulatory Visit: Payer: Self-pay

## 2024-05-22 DIAGNOSIS — Z1231 Encounter for screening mammogram for malignant neoplasm of breast: Secondary | ICD-10-CM

## 2024-05-24 ENCOUNTER — Other Ambulatory Visit: Payer: Self-pay | Admitting: Nurse Practitioner

## 2024-05-24 ENCOUNTER — Ambulatory Visit
Admission: RE | Admit: 2024-05-24 | Discharge: 2024-05-24 | Disposition: A | Discharge status: Home / Self Care | Source: Ambulatory Visit | Attending: Nurse Practitioner | Admitting: Nurse Practitioner

## 2024-05-24 DIAGNOSIS — Z1231 Encounter for screening mammogram for malignant neoplasm of breast: Secondary | ICD-10-CM

## 2024-06-13 ENCOUNTER — Ambulatory Visit: Admitting: Obstetrics and Gynecology
